# Patient Record
Sex: Male | Born: 1955 | Race: White | Hispanic: No | Marital: Married | Smoking: Never smoker
Health system: Southern US, Community
[De-identification: ages and names within clinical notes are randomized; demographics above are authoritative.]

## PROBLEM LIST (undated history)

## (undated) DIAGNOSIS — G4730 Sleep apnea, unspecified: Secondary | ICD-10-CM

## (undated) DIAGNOSIS — M109 Gout, unspecified: Secondary | ICD-10-CM

## (undated) DIAGNOSIS — I10 Essential (primary) hypertension: Secondary | ICD-10-CM

## (undated) DIAGNOSIS — G4733 Obstructive sleep apnea (adult) (pediatric): Principal | ICD-10-CM

## (undated) HISTORY — PX: COLONOSCOPY: SHX174

## (undated) HISTORY — PX: POLYPECTOMY: SHX149

## (undated) HISTORY — DX: Gout, unspecified: M10.9

## (undated) HISTORY — DX: Obstructive sleep apnea (adult) (pediatric): G47.33

## (undated) HISTORY — PX: ROTATOR CUFF REPAIR: SHX139

## (undated) HISTORY — DX: Sleep apnea, unspecified: G47.30

## (undated) HISTORY — DX: Essential (primary) hypertension: I10

---

## 1998-05-12 ENCOUNTER — Emergency Department (HOSPITAL_COMMUNITY): Admission: EM | Admit: 1998-05-12 | Discharge: 1998-05-12 | Payer: Self-pay | Admitting: Emergency Medicine

## 2005-01-16 ENCOUNTER — Ambulatory Visit: Payer: Self-pay | Admitting: Family Medicine

## 2005-03-20 ENCOUNTER — Ambulatory Visit: Payer: Self-pay | Admitting: Family Medicine

## 2005-03-28 ENCOUNTER — Ambulatory Visit: Payer: Self-pay | Admitting: Family Medicine

## 2005-04-11 ENCOUNTER — Ambulatory Visit: Payer: Self-pay | Admitting: Family Medicine

## 2006-09-14 ENCOUNTER — Ambulatory Visit: Payer: Self-pay | Admitting: Family Medicine

## 2008-01-17 ENCOUNTER — Ambulatory Visit: Payer: Self-pay | Admitting: Family Medicine

## 2008-01-17 LAB — CONVERTED CEMR LAB
AST: 24 units/L (ref 0–37)
Albumin: 4.3 g/dL (ref 3.5–5.2)
Basophils Absolute: 0 10*3/uL (ref 0.0–0.1)
Bilirubin Urine: NEGATIVE
Chloride: 100 meq/L (ref 96–112)
Direct LDL: 115.5 mg/dL
Eosinophils Absolute: 0.1 10*3/uL (ref 0.0–0.6)
GFR calc non Af Amer: 75 mL/min
Glucose, Urine, Semiquant: NEGATIVE
HCT: 45 % (ref 39.0–52.0)
HDL: 33.7 mg/dL — ABNORMAL LOW (ref >39.0)
MCHC: 34.8 g/dL (ref 30.0–36.0)
MCV: 91.8 fL (ref 78.0–100.0)
Neutrophils Relative %: 56.9 % (ref 43.0–77.0)
PSA: 0.65 ng/mL (ref 0.10–4.00)
Platelets: 173 10*3/uL (ref 150–400)
RBC: 4.9 M/uL (ref 4.22–5.81)
Sodium: 139 meq/L (ref 135–145)
Specific Gravity, Urine: 1.025
TSH: 1.18 microintl units/mL (ref 0.35–5.50)
Triglycerides: 309 mg/dL (ref 0–149)
pH: 5.5

## 2008-01-24 ENCOUNTER — Ambulatory Visit: Payer: Self-pay | Admitting: Family Medicine

## 2008-01-24 DIAGNOSIS — I10 Essential (primary) hypertension: Secondary | ICD-10-CM

## 2008-11-10 ENCOUNTER — Ambulatory Visit: Payer: Self-pay | Admitting: Gastroenterology

## 2008-11-24 ENCOUNTER — Encounter: Payer: Self-pay | Admitting: Gastroenterology

## 2008-11-24 ENCOUNTER — Ambulatory Visit: Payer: Self-pay | Admitting: Gastroenterology

## 2008-11-24 LAB — HM COLONOSCOPY

## 2008-11-27 ENCOUNTER — Encounter: Payer: Self-pay | Admitting: Gastroenterology

## 2008-12-11 ENCOUNTER — Ambulatory Visit: Payer: Self-pay | Admitting: Family Medicine

## 2008-12-11 DIAGNOSIS — R079 Chest pain, unspecified: Secondary | ICD-10-CM

## 2009-05-29 ENCOUNTER — Ambulatory Visit: Payer: Self-pay | Admitting: Family Medicine

## 2009-05-29 LAB — CONVERTED CEMR LAB
BUN: 10 mg/dL (ref 6–23)
Basophils Relative: 0.8 % (ref 0.0–3.0)
Bilirubin Urine: NEGATIVE
Bilirubin, Direct: 0.2 mg/dL (ref 0.0–0.3)
CO2: 32 meq/L (ref 19–32)
Chloride: 101 meq/L (ref 96–112)
Cholesterol: 215 mg/dL — ABNORMAL HIGH (ref 0–200)
Creatinine, Ser: 1.2 mg/dL (ref 0.4–1.5)
Direct LDL: 122.9 mg/dL
Eosinophils Absolute: 0.1 10*3/uL (ref 0.0–0.7)
Glucose, Urine, Semiquant: NEGATIVE
HCT: 43.3 % (ref 39.0–52.0)
Lymphs Abs: 2.3 10*3/uL (ref 0.7–4.0)
MCHC: 35.4 g/dL (ref 30.0–36.0)
MCV: 94 fL (ref 78.0–100.0)
Monocytes Absolute: 0.5 10*3/uL (ref 0.1–1.0)
Neutrophils Relative %: 49.6 % (ref 43.0–77.0)
PSA: 0.94 ng/mL (ref 0.10–4.00)
Platelets: 172 10*3/uL (ref 150.0–400.0)
Specific Gravity, Urine: >=1.03
TSH: 1.18 microintl units/mL (ref 0.35–5.50)
Total Bilirubin: 1.7 mg/dL — ABNORMAL HIGH (ref 0.3–1.2)
Total Protein: 8 g/dL (ref 6.0–8.3)
pH: 5.5

## 2009-06-05 ENCOUNTER — Ambulatory Visit: Payer: Self-pay | Admitting: Family Medicine

## 2009-06-05 DIAGNOSIS — R5381 Other malaise: Secondary | ICD-10-CM | POA: Insufficient documentation

## 2009-06-05 DIAGNOSIS — R5383 Other fatigue: Secondary | ICD-10-CM

## 2009-06-05 DIAGNOSIS — I491 Atrial premature depolarization: Secondary | ICD-10-CM | POA: Insufficient documentation

## 2010-09-23 ENCOUNTER — Encounter: Payer: Self-pay | Admitting: Family Medicine

## 2010-12-03 NOTE — Miscellaneous (Signed)
Summary: flu vaccine  Clinical Lists Changes  Observations: Added new observation of FLU VAX: Historical (09/21/2010 11:39)      Immunization History:  Influenza Immunization History:    Influenza:  historical (09/21/2010)

## 2011-03-25 ENCOUNTER — Other Ambulatory Visit (INDEPENDENT_AMBULATORY_CARE_PROVIDER_SITE_OTHER): Payer: BC Managed Care – PPO

## 2011-03-25 DIAGNOSIS — Z0000 Encounter for general adult medical examination without abnormal findings: Secondary | ICD-10-CM

## 2011-03-25 DIAGNOSIS — Z13220 Encounter for screening for lipoid disorders: Secondary | ICD-10-CM

## 2011-03-25 LAB — LIPID PANEL
Cholesterol: 206 mg/dL — ABNORMAL HIGH (ref 0–200)
HDL: 39.5 mg/dL (ref >39.00)
VLDL: 88.8 mg/dL — ABNORMAL HIGH (ref 0.0–40.0)

## 2011-03-25 LAB — BASIC METABOLIC PANEL
BUN: 18 mg/dL (ref 6–23)
Chloride: 104 mEq/L (ref 96–112)
Creatinine, Ser: 1.3 mg/dL (ref 0.4–1.5)
GFR: 63.85 mL/min (ref >60.00)

## 2011-03-25 LAB — CBC WITH DIFFERENTIAL/PLATELET
Basophils Absolute: 0 10*3/uL (ref 0.0–0.1)
Eosinophils Absolute: 0.2 10*3/uL (ref 0.0–0.7)
HCT: 42.3 % (ref 39.0–52.0)
Hemoglobin: 14.9 g/dL (ref 13.0–17.0)
Lymphs Abs: 2.2 10*3/uL (ref 0.7–4.0)
MCHC: 35.3 g/dL (ref 30.0–36.0)
Neutro Abs: 2.7 10*3/uL (ref 1.4–7.7)
RDW: 13.1 % (ref 11.5–14.6)

## 2011-03-25 LAB — POCT URINALYSIS DIPSTICK
Glucose, UA: NEGATIVE
Protein, UA: NEGATIVE
Spec Grav, UA: 1.02
Urobilinogen, UA: 0.2

## 2011-03-25 LAB — TSH: TSH: 1.21 u[IU]/mL (ref 0.35–5.50)

## 2011-03-25 LAB — HEPATIC FUNCTION PANEL: Albumin: 4.1 g/dL (ref 3.5–5.2)

## 2011-04-04 ENCOUNTER — Encounter: Payer: Self-pay | Admitting: Family Medicine

## 2011-04-07 ENCOUNTER — Ambulatory Visit (INDEPENDENT_AMBULATORY_CARE_PROVIDER_SITE_OTHER): Payer: BC Managed Care – PPO | Admitting: Family Medicine

## 2011-04-07 ENCOUNTER — Encounter: Payer: Self-pay | Admitting: Family Medicine

## 2011-04-07 VITALS — BP 108/78 | Temp 98.3°F | Ht 73.0 in | Wt 230.0 lb

## 2011-04-07 DIAGNOSIS — I10 Essential (primary) hypertension: Secondary | ICD-10-CM

## 2011-04-07 MED ORDER — ATENOLOL-CHLORTHALIDONE 50-25 MG PO TABS: ORAL_TABLET | ORAL | Status: DC

## 2011-04-07 NOTE — Patient Instructions (Signed)
Continue your current medications.  Follow-up in one year sooner for any problems

## 2011-04-07 NOTE — Progress Notes (Signed)
  Subjective:    Patient ID: Garrett Avila, male    DOB: 06/23/1956, 55 y.o.   MRN: [id number]  Garrett Avila is a delightful, 55 year old, married man nonsmoker, who comes in today for physical examination because of a history of underlying hypertension.  He takes Tenoretic 50 -- 25 dose one half tab daily BP 108/78.  He gets routine eye care, dental care, colonoscopy two years ago, showed one polyp, tetanus, 2009  Review of systems negative except for pain in his left heel.  He is currently going to [facility].  He has a bone spur.  In addition to his normal work at [employer].  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  He gets a lot of sun exposure.  Advised sun protection daily    Review of Systems  Constitutional: Negative.   HENT: Negative.   Eyes: Negative.   Respiratory: Negative.   Cardiovascular: Negative.   Gastrointestinal: Negative.   Genitourinary: Negative.   Musculoskeletal: Negative.   Skin: Negative.   Neurological: Negative.   Hematological: Negative.   Psychiatric/Behavioral: Negative.        Objective:   Physical Exam  Constitutional: He is oriented to person, place, and time. He appears well-developed and well-nourished.  HENT:  Head: Normocephalic and atraumatic.  Right Ear: External ear normal.  Left Ear: External ear normal.  Nose: Nose normal.  Mouth/Throat: Oropharynx is clear and moist.  Eyes: Conjunctivae and EOM are normal. Pupils are equal, round, and reactive to light.  Neck: Normal range of motion. Neck supple. No JVD present. No tracheal deviation present. No thyromegaly present.  Cardiovascular: Normal rate, regular rhythm, normal heart sounds and intact distal pulses.  Exam reveals no gallop and no friction rub.   No murmur heard. Pulmonary/Chest: Effort normal and breath sounds normal. No stridor. No respiratory distress. He has no wheezes. He has no rales. He exhibits no tenderness.  Abdominal: Soft. Bowel sounds are normal. He exhibits no distension and  no mass. There is no tenderness. There is no rebound and no guarding.  Genitourinary: Rectum normal, prostate normal and penis normal. Guaiac negative stool. No penile tenderness.  Musculoskeletal: Normal range of motion. He exhibits no edema and no tenderness.  Lymphadenopathy:    He has no cervical adenopathy.  Neurological: He is alert and oriented to person, place, and time. He has normal reflexes. No cranial nerve deficit. He exhibits normal muscle tone.  Skin: Skin is warm and dry. No rash noted. No erythema. No pallor.  Psychiatric: He has a normal mood and affect. His behavior is normal. Judgment and thought content normal.          Assessment & Plan:  Hypertension continue current medications follow-up in one year, sooner for any problems

## 2012-04-13 ENCOUNTER — Other Ambulatory Visit: Payer: Self-pay | Admitting: Family Medicine

## 2012-04-29 ENCOUNTER — Ambulatory Visit (INDEPENDENT_AMBULATORY_CARE_PROVIDER_SITE_OTHER): Payer: BC Managed Care – PPO | Admitting: Family Medicine

## 2012-04-29 ENCOUNTER — Encounter: Payer: Self-pay | Admitting: Family Medicine

## 2012-04-29 VITALS — BP 110/80 | Temp 98.2°F | Wt 226.0 lb

## 2012-04-29 DIAGNOSIS — L03032 Cellulitis of left toe: Secondary | ICD-10-CM

## 2012-04-29 DIAGNOSIS — L02619 Cutaneous abscess of unspecified foot: Secondary | ICD-10-CM

## 2012-04-29 LAB — URIC ACID: Uric Acid, Serum: 8.3 mg/dL — ABNORMAL HIGH (ref 4.0–7.8)

## 2012-04-29 MED ORDER — CEPHALEXIN 500 MG PO CAPS: ORAL_CAPSULE | ORAL | Status: DC

## 2012-04-29 NOTE — Progress Notes (Signed)
  Subjective:    Patient ID: Garrett Avila, male    DOB: 11-03-1956, 56 y.o.   MRN: [id number]  HPI Garrett Avila is a 56 year old married male nonsmoker nondrinker who comes in today for evaluation of pain and redness of his left great toe x6 days  He states last week he was at the beach and on Friday he noticed some redness of his left great. It involves the distal joint and just the dorsum of the toe. No history of trauma no family history of gout it really hasn't changed since that time no fever chills   Review of Systems General and dermatologic review of systems otherwise negative    Objective:   Physical Exam  well-developed well-nourished male in no acute distress examination of the foot shows the skin to be normal except for some erythema of the dorsum of the left great toe distal joint. No pus       Assessment & Plan:  Probable cellulitis left great toe plan check uric acid level in the meantime treat with Keflex and warm soaks

## 2012-04-29 NOTE — Patient Instructions (Signed)
Take 2 of the Keflex tablets twice daily  Soaked her foot in warm water for 15 minutes twice daily  Fleet Contras will call you tomorrow about your lab work

## 2012-07-22 ENCOUNTER — Other Ambulatory Visit: Payer: Self-pay | Admitting: Family Medicine

## 2012-12-02 ENCOUNTER — Other Ambulatory Visit: Payer: Self-pay | Admitting: Family Medicine

## 2012-12-02 DIAGNOSIS — Z8349 Family history of other endocrine, nutritional and metabolic diseases: Secondary | ICD-10-CM

## 2012-12-06 ENCOUNTER — Other Ambulatory Visit (INDEPENDENT_AMBULATORY_CARE_PROVIDER_SITE_OTHER): Payer: BC Managed Care – PPO

## 2012-12-06 DIAGNOSIS — Z8349 Family history of other endocrine, nutritional and metabolic diseases: Secondary | ICD-10-CM

## 2012-12-06 DIAGNOSIS — Z0000 Encounter for general adult medical examination without abnormal findings: Secondary | ICD-10-CM

## 2012-12-06 LAB — POCT URINALYSIS DIPSTICK
Bilirubin, UA: NEGATIVE
Blood, UA: NEGATIVE
Nitrite, UA: NEGATIVE
Spec Grav, UA: 1.02
pH, UA: 7

## 2012-12-06 LAB — CBC WITH DIFFERENTIAL/PLATELET
Basophils Relative: 0.4 % (ref 0.0–3.0)
Eosinophils Relative: 2.1 % (ref 0.0–5.0)
HCT: 44.1 % (ref 39.0–52.0)
Hemoglobin: 15.2 g/dL (ref 13.0–17.0)
Lymphs Abs: 2.3 10*3/uL (ref 0.7–4.0)
MCV: 92.1 fl (ref 78.0–100.0)
Monocytes Absolute: 0.5 10*3/uL (ref 0.1–1.0)
RBC: 4.79 Mil/uL (ref 4.22–5.81)
WBC: 5.8 10*3/uL (ref 4.5–10.5)

## 2012-12-06 LAB — HEPATIC FUNCTION PANEL
ALT: 24 U/L (ref 0–53)
AST: 24 U/L (ref 0–37)
Bilirubin, Direct: 0.1 mg/dL (ref 0.0–0.3)
Total Protein: 7.5 g/dL (ref 6.0–8.3)

## 2012-12-06 LAB — LIPID PANEL
HDL: 37.9 mg/dL — ABNORMAL LOW (ref >39.00)
Total CHOL/HDL Ratio: 6

## 2012-12-06 LAB — BASIC METABOLIC PANEL
Chloride: 99 mEq/L (ref 96–112)
Potassium: 3.6 mEq/L (ref 3.5–5.1)
Sodium: 138 mEq/L (ref 135–145)

## 2012-12-06 LAB — LDL CHOLESTEROL, DIRECT: Direct LDL: 116.6 mg/dL

## 2012-12-06 LAB — FERRITIN: Ferritin: 119.2 ng/mL (ref 22.0–322.0)

## 2012-12-06 LAB — IBC PANEL: Iron: 99 ug/dL (ref 42–165)

## 2012-12-06 LAB — TSH: TSH: 1.33 u[IU]/mL (ref 0.35–5.50)

## 2012-12-06 LAB — PSA: PSA: 0.86 ng/mL (ref 0.10–4.00)

## 2012-12-06 NOTE — Addendum Note (Signed)
Addended by: Bonnye Fava on: 12/06/2012 11:03 AM   Modules accepted: Orders

## 2012-12-10 LAB — HEMOCHROMATOSIS DNA-PCR(C282Y,H63D)

## 2012-12-13 ENCOUNTER — Encounter: Payer: Self-pay | Admitting: Family Medicine

## 2012-12-13 ENCOUNTER — Ambulatory Visit (INDEPENDENT_AMBULATORY_CARE_PROVIDER_SITE_OTHER): Payer: BC Managed Care – PPO | Admitting: Family Medicine

## 2012-12-13 VITALS — BP 140/88 | HR 60 | Temp 98.6°F | Ht 72.5 in | Wt 209.0 lb

## 2012-12-13 DIAGNOSIS — I10 Essential (primary) hypertension: Secondary | ICD-10-CM

## 2012-12-13 DIAGNOSIS — I491 Atrial premature depolarization: Secondary | ICD-10-CM

## 2012-12-13 MED ORDER — ATENOLOL-CHLORTHALIDONE 50-25 MG PO TABS: ORAL_TABLET | ORAL | Status: DC

## 2012-12-13 NOTE — Progress Notes (Signed)
  Subjective:    Patient ID: Garrett Avila, male    DOB: 1956/02/22, 57 y.o.   MRN: [id number]  HPI Garrett Avila is a 57 year old married male nonsmoker who comes in today for annual physical examination because of a history of hypertension  He takes Tenoretic 50 days 25 dose one half tab daily BP at home 120/76  He is a truck driver by trade and works at [employer]  He gets routine eye care, dental care, colonoscopy normal, seasonal flu shot 2013, tetanus 2009.   Review of Systems  Constitutional: Negative.   HENT: Negative.   Eyes: Negative.   Respiratory: Negative.   Cardiovascular: Negative.   Gastrointestinal: Negative.   Genitourinary: Negative.   Musculoskeletal: Negative.   Skin: Negative.   Neurological: Negative.   Psychiatric/Behavioral: Negative.        Objective:   Physical Exam  Constitutional: He is oriented to person, place, and time. He appears well-developed and well-nourished.  HENT:  Head: Normocephalic and atraumatic.  Right Ear: External ear normal.  Left Ear: External ear normal.  Nose: Nose normal.  Mouth/Throat: Oropharynx is clear and moist.  Eyes: Conjunctivae and EOM are normal. Pupils are equal, round, and reactive to light.  Neck: Normal range of motion. Neck supple. No JVD present. No tracheal deviation present. No thyromegaly present.  Cardiovascular: Normal rate, regular rhythm, normal heart sounds and intact distal pulses.  Exam reveals no gallop and no friction rub.   No murmur heard. Pulmonary/Chest: Effort normal and breath sounds normal. No stridor. No respiratory distress. He has no wheezes. He has no rales. He exhibits no tenderness.  Abdominal: Soft. Bowel sounds are normal. He exhibits no distension and no mass. There is no tenderness. There is no rebound and no guarding.  Genitourinary: Rectum normal, prostate normal and penis normal. Guaiac negative stool. No penile tenderness.  Musculoskeletal: Normal range of motion. He exhibits no edema  and no tenderness.  Lymphadenopathy:    He has no cervical adenopathy.  Neurological: He is alert and oriented to person, place, and time. He has normal reflexes. No cranial nerve deficit. He exhibits normal muscle tone.  Skin: Skin is warm and dry. No rash noted. No erythema. No pallor.  Total body skin exam normal light skin and light eyes going raised in [place]  Psychiatric: He has a normal mood and affect. His behavior is normal. Judgment and thought content normal.          Assessment & Plan:  Healthy male  Hypertension at goal continue current medication  Risk for skin cancer again advise sunscreens SPF 50

## 2012-12-13 NOTE — Patient Instructions (Signed)
Continue your current medications  Remember your skin protection SPF 50+  Return in one year for general physical examination sooner if any problems

## 2013-07-06 ENCOUNTER — Other Ambulatory Visit: Payer: Self-pay | Admitting: Family Medicine

## 2013-10-14 ENCOUNTER — Encounter: Payer: Self-pay | Admitting: Internal Medicine

## 2013-12-14 ENCOUNTER — Encounter: Payer: Self-pay | Admitting: Gastroenterology

## 2014-02-24 ENCOUNTER — Ambulatory Visit (AMBULATORY_SURGERY_CENTER): Payer: Self-pay

## 2014-02-24 VITALS — Ht 73.0 in | Wt 208.0 lb

## 2014-02-24 DIAGNOSIS — Z860100 Personal history of colon polyps, unspecified: Secondary | ICD-10-CM

## 2014-02-24 MED ORDER — SOD PICOSULFATE-MAG OX-CIT ACD 10-3.5-12 MG-GM-GM PO PACK: 1.0000 | PACK | Freq: Once | ORAL | Status: DC

## 2014-02-24 NOTE — Progress Notes (Signed)
No allergies to eggs or soy. No home oxygen. No diet/weight loss meds. No email.

## 2014-03-01 ENCOUNTER — Encounter: Payer: Self-pay | Admitting: Gastroenterology

## 2014-03-03 ENCOUNTER — Encounter: Payer: Self-pay | Admitting: Gastroenterology

## 2014-03-03 ENCOUNTER — Ambulatory Visit (AMBULATORY_SURGERY_CENTER): Payer: BC Managed Care – PPO | Admitting: Gastroenterology

## 2014-03-03 VITALS — BP 117/72 | HR 51 | Temp 96.9°F | Resp 16 | Ht 73.0 in | Wt 208.0 lb

## 2014-03-03 DIAGNOSIS — D129 Benign neoplasm of anus and anal canal: Secondary | ICD-10-CM

## 2014-03-03 DIAGNOSIS — D128 Benign neoplasm of rectum: Secondary | ICD-10-CM

## 2014-03-03 DIAGNOSIS — D126 Benign neoplasm of colon, unspecified: Secondary | ICD-10-CM

## 2014-03-03 DIAGNOSIS — Z86010 Personal history of colonic polyps: Secondary | ICD-10-CM

## 2014-03-03 MED ORDER — SODIUM CHLORIDE 0.9 % IV SOLN: 500.0000 mL | INTRAVENOUS | Status: DC

## 2014-03-03 NOTE — Progress Notes (Signed)
Called to room to assist during endoscopic procedure.  Patient ID and intended procedure confirmed with present staff. Received instructions for my participation in the procedure from the performing physician.  

## 2014-03-03 NOTE — Progress Notes (Signed)
Procedure ends, to recovery, report given and VSS. 

## 2014-03-03 NOTE — Op Note (Signed)
[facility]  [address] [place], [id number]   COLONOSCOPY PROCEDURE REPORT  PATIENT: Garrett Avila, Garrett Avila  MR#: [id number] BIRTHDATE: 08/18/1956 , 18  yrs. old GENDER: Male ENDOSCOPIST: Ladene Artist, MD, [facility] PROCEDURE DATE:  03/03/2014 PROCEDURE:   Colonoscopy with biopsy First Screening Colonoscopy - Avg.  risk and is 50 yrs.  old or older - No.  Prior Negative Screening - Now for repeat screening. N/A  History of Adenoma - Now for follow-up colonoscopy & has been > or = to 3 yrs.  Yes hx of adenoma.  Has been 3 or more years since last colonoscopy.  Polyps Removed Today? Yes. ASA CLASS:   Class II INDICATIONS:Patient's personal history of adenomatous colon polyps.  MEDICATIONS: MAC sedation, administered by CRNA and propofol (Diprivan) 200mg  IV DESCRIPTION OF PROCEDURE:   After the risks benefits and alternatives of the procedure were thoroughly explained, informed consent was obtained.  A digital rectal exam revealed no abnormalities of the rectum.   The LB [id number]  endoscope was introduced through the anus and advanced to the cecum, which was identified by both the appendix and ileocecal valve. No adverse events experienced.   The quality of the prep was [place] good The instrument was then slowly withdrawn as the colon was fully examined.  COLON FINDINGS: A sessile polyp measuring 4 mm in size was found in the transverse colon.  A polypectomy was performed with cold forceps.  The resection was complete and the polyp tissue was completely retrieved.   The colon was otherwise normal.  There was no diverticulosis, inflammation, polyps or cancers unless previously stated.  Retroflexed views revealed small internal hemorrhoids. The time to cecum=1 minutes 55 seconds.  Withdrawal time=10 minutes 51 seconds.  The scope was withdrawn and the procedure completed.  COMPLICATIONS: There were no complications.  ENDOSCOPIC IMPRESSION: 1.   Sessile  polyp measuring 4 mm in the transverse colon; polypectomy performed with cold forceps 2.   Small internal hemorrhoids  RECOMMENDATIONS: 1.  Await pathology results 2.  Repeat Colonoscopy in 5 years.  eSigned:  Ladene Artist, MD, [facility] 03/03/2014 9:25 AM

## 2014-03-03 NOTE — Patient Instructions (Signed)
YOU HAD AN ENDOSCOPIC PROCEDURE TODAY AT THE [facility]: Refer to the procedure report that was given to you for any specific questions about what was found during the examination.  If the procedure report does not answer your questions, please call your gastroenterologist to clarify.  If you requested that your care partner not be given the details of your procedure findings, then the procedure report has been included in a sealed envelope for you to review at your convenience later.  YOU SHOULD EXPECT: Some feelings of bloating in the abdomen. Passage of more gas than usual.  Walking can help get rid of the air that was put into your GI tract during the procedure and reduce the bloating. If you had a lower endoscopy (such as a colonoscopy or flexible sigmoidoscopy) you may notice spotting of blood in your stool or on the toilet paper. If you underwent a bowel prep for your procedure, then you may not have a normal bowel movement for a few days.  DIET: Your first meal following the procedure should be a light meal and then it is ok to progress to your normal diet.  A half-sandwich or bowl of soup is an example of a good first meal.  Heavy or fried foods are harder to digest and may make you feel nauseous or bloated.  Likewise meals heavy in dairy and vegetables can cause extra gas to form and this can also increase the bloating.  Drink plenty of fluids but you should avoid alcoholic beverages for 24 hours.  ACTIVITY: Your care partner should take you home directly after the procedure.  You should plan to take it easy, moving slowly for the rest of the day.  You can resume normal activity the day after the procedure however you should NOT DRIVE or use heavy machinery for 24 hours (because of the sedation medicines used during the test).    SYMPTOMS TO REPORT IMMEDIATELY: A gastroenterologist can be reached at any hour.  During normal business hours, 8:30 AM to 5:00 PM Monday through Friday,  call [phone].  After hours and on weekends, please call the GI answering service at [phone] who will take a message and have the physician on call contact you.   Following lower endoscopy (colonoscopy or flexible sigmoidoscopy):  Excessive amounts of blood in the stool  Significant tenderness or worsening of abdominal pains  Swelling of the abdomen that is new, acute  Fever of 100F or higher    FOLLOW UP: If any biopsies were taken you will be contacted by phone or by letter within the next 1-3 weeks.  Call your gastroenterologist if you have not heard about the biopsies in 3 weeks.  Our staff will call the home number listed on your records the next business day following your procedure to check on you and address any questions or concerns that you may have at that time regarding the information given to you following your procedure. This is a courtesy call and so if there is no answer at the home number and we have not heard from you through the emergency physician on call, we will assume that you have returned to your regular daily activities without incident.  SIGNATURES/CONFIDENTIALITY: You and/or your care partner have signed paperwork which will be entered into your electronic medical record.  These signatures attest to the fact that that the information above on your After Visit Summary has been reviewed and is understood.  Full responsibility of the confidentiality   of this discharge information lies with you and/or your care-partner.     

## 2014-03-06 ENCOUNTER — Telehealth: Payer: Self-pay | Admitting: *Deleted

## 2014-03-06 NOTE — Telephone Encounter (Signed)
  Follow up Call-  Call back number 03/03/2014  Post procedure Call Back phone  # [phone]  Permission to leave phone message Yes     Patient questions:  Do you have a fever, pain , or abdominal swelling? no Pain Score  0 *  Have you tolerated food without any problems? yes  Have you been able to return to your normal activities? yes  Do you have any questions about your discharge instructions: Diet   no Medications  no Follow up visit  no  Do you have questions or concerns about your Care? no  Actions: * If pain score is 4 or above: No action needed, pain <4.

## 2014-03-07 ENCOUNTER — Encounter: Payer: Self-pay | Admitting: Gastroenterology

## 2014-06-08 ENCOUNTER — Telehealth: Payer: Self-pay | Admitting: Family Medicine

## 2014-06-08 MED ORDER — ATENOLOL-CHLORTHALIDONE 50-25 MG PO TABS: ORAL_TABLET | ORAL | Status: DC

## 2014-06-08 NOTE — Telephone Encounter (Signed)
Pt is on vacation at the beach and left medicine at home atenolol-chlorthalidone (TENORETIC) 50-25 MG per tablet   Would like about 4 days worth of pills sent to    [facility] [employer] number [phone]

## 2014-07-24 ENCOUNTER — Other Ambulatory Visit: Payer: Self-pay | Admitting: Family Medicine

## 2014-09-07 ENCOUNTER — Other Ambulatory Visit (INDEPENDENT_AMBULATORY_CARE_PROVIDER_SITE_OTHER): Payer: BC Managed Care – PPO

## 2014-09-07 DIAGNOSIS — E785 Hyperlipidemia, unspecified: Secondary | ICD-10-CM

## 2014-09-07 DIAGNOSIS — Z0000 Encounter for general adult medical examination without abnormal findings: Secondary | ICD-10-CM

## 2014-09-07 LAB — PSA: PSA: 1 ng/mL (ref 0.10–4.00)

## 2014-09-07 LAB — CBC WITH DIFFERENTIAL/PLATELET
Basophils Absolute: 0 10*3/uL (ref 0.0–0.1)
Basophils Relative: 0.4 % (ref 0.0–3.0)
EOS ABS: 0.2 10*3/uL (ref 0.0–0.7)
Eosinophils Relative: 2.4 % (ref 0.0–5.0)
HEMATOCRIT: 44.2 % (ref 39.0–52.0)
Hemoglobin: 14.9 g/dL (ref 13.0–17.0)
LYMPHS ABS: 2.4 10*3/uL (ref 0.7–4.0)
Lymphocytes Relative: 36.1 % (ref 12.0–46.0)
MCHC: 33.8 g/dL (ref 30.0–36.0)
MCV: 94.3 fl (ref 78.0–100.0)
MONO ABS: 0.6 10*3/uL (ref 0.1–1.0)
Monocytes Relative: 8.6 % (ref 3.0–12.0)
Neutro Abs: 3.5 10*3/uL (ref 1.4–7.7)
Neutrophils Relative %: 52.5 % (ref 43.0–77.0)
Platelets: 185 10*3/uL (ref 150.0–400.0)
RBC: 4.69 Mil/uL (ref 4.22–5.81)
RDW: 13.1 % (ref 11.5–15.5)
WBC: 6.7 10*3/uL (ref 4.0–10.5)

## 2014-09-07 LAB — POCT URINALYSIS DIPSTICK
BILIRUBIN UA: NEGATIVE
Blood, UA: NEGATIVE
GLUCOSE UA: NEGATIVE
Ketones, UA: NEGATIVE
Leukocytes, UA: NEGATIVE
Nitrite, UA: NEGATIVE
Protein, UA: NEGATIVE
SPEC GRAV UA: 1.015
UROBILINOGEN UA: 0.2
pH, UA: 7

## 2014-09-07 LAB — BASIC METABOLIC PANEL
BUN: 14 mg/dL (ref 6–23)
CHLORIDE: 101 meq/L (ref 96–112)
CO2: 27 mEq/L (ref 19–32)
Calcium: 9.6 mg/dL (ref 8.4–10.5)
Creatinine, Ser: 1.2 mg/dL (ref 0.4–1.5)
GFR: 66.74 mL/min (ref >60.00)
Glucose, Bld: 82 mg/dL (ref 70–99)
POTASSIUM: 3.8 meq/L (ref 3.5–5.1)
Sodium: 140 mEq/L (ref 135–145)

## 2014-09-07 LAB — TSH: TSH: 0.69 u[IU]/mL (ref 0.35–4.50)

## 2014-09-07 LAB — LIPID PANEL
CHOLESTEROL: 210 mg/dL — AB (ref 0–200)
HDL: 34.9 mg/dL — ABNORMAL LOW (ref >39.00)
NonHDL: 175.1
Total CHOL/HDL Ratio: 6
Triglycerides: 304 mg/dL — ABNORMAL HIGH (ref 0.0–149.0)
VLDL: 60.8 mg/dL — ABNORMAL HIGH (ref 0.0–40.0)

## 2014-09-07 LAB — HEPATIC FUNCTION PANEL
ALK PHOS: 68 U/L (ref 39–117)
ALT: 27 U/L (ref 0–53)
AST: 26 U/L (ref 0–37)
Albumin: 3.7 g/dL (ref 3.5–5.2)
BILIRUBIN TOTAL: 1.2 mg/dL (ref 0.2–1.2)
Bilirubin, Direct: 0.2 mg/dL (ref 0.0–0.3)
Total Protein: 7.7 g/dL (ref 6.0–8.3)

## 2014-09-07 LAB — LDL CHOLESTEROL, DIRECT: Direct LDL: 114.4 mg/dL

## 2014-09-14 ENCOUNTER — Encounter: Payer: Self-pay | Admitting: Family Medicine

## 2014-09-14 ENCOUNTER — Ambulatory Visit (INDEPENDENT_AMBULATORY_CARE_PROVIDER_SITE_OTHER): Payer: BC Managed Care – PPO | Admitting: Family Medicine

## 2014-09-14 DIAGNOSIS — I10 Essential (primary) hypertension: Secondary | ICD-10-CM

## 2014-09-14 MED ORDER — ATENOLOL-CHLORTHALIDONE 50-25 MG PO TABS: ORAL_TABLET | ORAL | Status: DC

## 2014-09-14 NOTE — Progress Notes (Signed)
   Subjective:    Patient ID: Garrett Avila, male    DOB: Mar 16, 1956, 58 y.o.   MRN: [id number]  HPI Garrett Avila is a delightful 58 year old married male nonsmoker who comes in today for general physical examination because of a history of underlying hypertension  He takes Tenoretic 50-12 0.5 dose one half tab daily BP 120/80  He gets routine eye care, dental care, colonoscopy recently normal, vaccinations updated by Apolonio Schneiders  His son-in-law's and ENT is going to give him a flu shot at home  He is married lives here in [place] originally from [place] area. 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   HENT: Negative.   Eyes: Negative.   Respiratory: Negative.   Cardiovascular: Negative.   Gastrointestinal: Negative.   Endocrine: Negative.   Genitourinary: Negative.   Musculoskeletal: Negative.   Skin: Negative.   Allergic/Immunologic: Negative.   Neurological: Negative.   Hematological: Negative.   Psychiatric/Behavioral: Negative.        Objective:   Physical Exam  Constitutional: He is oriented to person, place, and time. He appears well-developed and well-nourished.  HENT:  Head: Normocephalic and atraumatic.  Right Ear: External ear normal.  Left Ear: External ear normal.  Nose: Nose normal.  Mouth/Throat: Oropharynx is clear and moist.  Eyes: Conjunctivae and EOM are normal. Pupils are equal, round, and reactive to light.  Neck: Normal range of motion. Neck supple. No JVD present. No tracheal deviation present. No thyromegaly present.  Cardiovascular: Normal rate, regular rhythm, normal heart sounds and intact distal pulses.  Exam reveals no gallop and no friction rub.   No murmur heard. Pulmonary/Chest: Effort normal and breath sounds normal. No stridor. No respiratory distress. He has no wheezes. He has no rales. He exhibits no  tenderness.  Abdominal: Soft. Bowel sounds are normal. He exhibits no distension and no mass. There is no tenderness. There is no rebound and no guarding.  Genitourinary: Rectum normal, prostate normal and penis normal. Guaiac negative stool. No penile tenderness.  Musculoskeletal: Normal range of motion. He exhibits no edema or tenderness.  Lymphadenopathy:    He has no cervical adenopathy.  Neurological: He is alert and oriented to person, place, and time. He has normal reflexes. No cranial nerve deficit. He exhibits normal muscle tone.  Skin: Skin is warm and dry. No rash noted. No erythema. No pallor.  Light skin and light eyes total body skin exam normal  Psychiatric: He has a normal mood and affect. His behavior is normal. Judgment and thought content normal.  Nursing note and vitals reviewed.         Assessment & Plan:  Healthy male  Hypertension ago......... Continue current medication

## 2014-09-14 NOTE — Progress Notes (Signed)
Pre visit review using our clinic review tool, if applicable. No additional management support is needed unless otherwise documented below in the visit note. 

## 2014-09-14 NOTE — Patient Instructions (Signed)
Continue your current medications  Followup in 1 year sooner if any problems 

## 2014-09-20 ENCOUNTER — Telehealth: Payer: Self-pay | Admitting: Family Medicine

## 2014-09-20 NOTE — Telephone Encounter (Signed)
emmi mailed  °

## 2014-10-25 ENCOUNTER — Encounter: Payer: Self-pay | Admitting: Family Medicine

## 2014-10-25 ENCOUNTER — Ambulatory Visit (INDEPENDENT_AMBULATORY_CARE_PROVIDER_SITE_OTHER): Payer: BC Managed Care – PPO | Admitting: Family Medicine

## 2014-10-25 ENCOUNTER — Telehealth: Payer: Self-pay | Admitting: *Deleted

## 2014-10-25 VITALS — BP 120/84 | HR 64 | Temp 98.4°F | Ht 73.0 in | Wt 223.0 lb

## 2014-10-25 DIAGNOSIS — M1000 Idiopathic gout, unspecified site: Secondary | ICD-10-CM

## 2014-10-25 DIAGNOSIS — M109 Gout, unspecified: Secondary | ICD-10-CM | POA: Insufficient documentation

## 2014-10-25 MED ORDER — METHYLPREDNISOLONE 4 MG PO KIT: PACK | ORAL | Status: AC

## 2014-10-25 NOTE — Telephone Encounter (Signed)
Patient is requesting to switch to Dr Yong Channel.  Please call to schedule establish care appointment.  Thank you.

## 2014-10-25 NOTE — Progress Notes (Signed)
   Subjective:    Patient ID: Garrett Avila, male    DOB: [birth year], 58 y.o.   MRN: [id number]  HPI Here for 4 days of swelling and pain at the base of the left great toe. It feels better today. No recent trauma. This happened once before in the same spot about a year ago. Using Motrin.    Review of Systems  Constitutional: Negative.   Musculoskeletal: Positive for joint swelling and arthralgias.       Objective:   Physical Exam  Constitutional: He appears well-developed and well-nourished.  Musculoskeletal:  The left great toe is tender at the MTP with swelling. No warmth or erythema          Assessment & Plan:  This is gout. Given a Medrol dose pack.

## 2014-10-25 NOTE — Progress Notes (Signed)
Pre visit review using our clinic review tool, if applicable. No additional management support is needed unless otherwise documented below in the visit note. 

## 2014-10-26 NOTE — Telephone Encounter (Signed)
Pt has been sch

## 2014-10-26 NOTE — Telephone Encounter (Signed)
L/m with pt wife

## 2015-04-10 ENCOUNTER — Ambulatory Visit: Payer: BC Managed Care – PPO | Admitting: Family Medicine

## 2015-04-25 ENCOUNTER — Encounter: Payer: Self-pay | Admitting: Gastroenterology

## 2015-06-04 ENCOUNTER — Ambulatory Visit: Payer: Self-pay | Admitting: Family Medicine

## 2015-10-02 ENCOUNTER — Other Ambulatory Visit: Payer: Self-pay | Admitting: Family Medicine

## 2015-12-31 ENCOUNTER — Other Ambulatory Visit: Payer: Self-pay | Admitting: Family Medicine

## 2016-04-28 ENCOUNTER — Encounter: Payer: Self-pay | Admitting: Family Medicine

## 2016-04-28 ENCOUNTER — Other Ambulatory Visit (INDEPENDENT_AMBULATORY_CARE_PROVIDER_SITE_OTHER): Payer: Self-pay

## 2016-04-28 DIAGNOSIS — R7989 Other specified abnormal findings of blood chemistry: Secondary | ICD-10-CM

## 2016-04-28 DIAGNOSIS — Z0000 Encounter for general adult medical examination without abnormal findings: Secondary | ICD-10-CM

## 2016-04-28 DIAGNOSIS — R319 Hematuria, unspecified: Secondary | ICD-10-CM | POA: Diagnosis not present

## 2016-04-28 LAB — BASIC METABOLIC PANEL
BUN: 14 mg/dL (ref 6–23)
CALCIUM: 9.9 mg/dL (ref 8.4–10.5)
CHLORIDE: 99 meq/L (ref 96–112)
CO2: 31 meq/L (ref 19–32)
Creatinine, Ser: 1.35 mg/dL (ref 0.40–1.50)
GFR: 57.37 mL/min — ABNORMAL LOW (ref >60.00)
GLUCOSE: 133 mg/dL — AB (ref 70–99)
POTASSIUM: 4 meq/L (ref 3.5–5.1)
SODIUM: 139 meq/L (ref 135–145)

## 2016-04-28 LAB — POC URINALSYSI DIPSTICK (AUTOMATED)
Bilirubin, UA: NEGATIVE
GLUCOSE UA: NEGATIVE
Ketones, UA: NEGATIVE
Leukocytes, UA: NEGATIVE
NITRITE UA: NEGATIVE
PH UA: 7
SPEC GRAV UA: 1.02
UROBILINOGEN UA: 0.2

## 2016-04-28 LAB — LIPID PANEL
CHOLESTEROL: 234 mg/dL — AB (ref 0–200)
HDL: 43.4 mg/dL (ref >39.00)
NonHDL: 190.53
Total CHOL/HDL Ratio: 5
Triglycerides: 314 mg/dL — ABNORMAL HIGH (ref 0.0–149.0)
VLDL: 62.8 mg/dL — AB (ref 0.0–40.0)

## 2016-04-28 LAB — URINALYSIS, MICROSCOPIC ONLY: RBC / HPF: NONE SEEN (ref 0–?)

## 2016-04-28 LAB — CBC WITH DIFFERENTIAL/PLATELET
BASOS PCT: 0.4 % (ref 0.0–3.0)
Basophils Absolute: 0 10*3/uL (ref 0.0–0.1)
EOS PCT: 1.3 % (ref 0.0–5.0)
Eosinophils Absolute: 0.1 10*3/uL (ref 0.0–0.7)
HEMATOCRIT: 46.3 % (ref 39.0–52.0)
Hemoglobin: 15.7 g/dL (ref 13.0–17.0)
LYMPHS ABS: 2.2 10*3/uL (ref 0.7–4.0)
Lymphocytes Relative: 29.5 % (ref 12.0–46.0)
MCHC: 33.9 g/dL (ref 30.0–36.0)
MCV: 93 fl (ref 78.0–100.0)
MONOS PCT: 6.7 % (ref 3.0–12.0)
Monocytes Absolute: 0.5 10*3/uL (ref 0.1–1.0)
NEUTROS ABS: 4.6 10*3/uL (ref 1.4–7.7)
NEUTROS PCT: 62.1 % (ref 43.0–77.0)
PLATELETS: 180 10*3/uL (ref 150.0–400.0)
RBC: 4.98 Mil/uL (ref 4.22–5.81)
RDW: 13.5 % (ref 11.5–15.5)
WBC: 7.5 10*3/uL (ref 4.0–10.5)

## 2016-04-28 LAB — PSA: PSA: 1.2 ng/mL (ref 0.10–4.00)

## 2016-04-28 LAB — LDL CHOLESTEROL, DIRECT: LDL DIRECT: 139 mg/dL

## 2016-04-28 LAB — TSH: TSH: 1.39 u[IU]/mL (ref 0.35–4.50)

## 2016-04-28 LAB — HEPATIC FUNCTION PANEL
ALBUMIN: 4.5 g/dL (ref 3.5–5.2)
ALT: 28 U/L (ref 0–53)
AST: 24 U/L (ref 0–37)
Alkaline Phosphatase: 66 U/L (ref 39–117)
Bilirubin, Direct: 0.2 mg/dL (ref 0.0–0.3)
TOTAL PROTEIN: 7.4 g/dL (ref 6.0–8.3)
Total Bilirubin: 1.6 mg/dL — ABNORMAL HIGH (ref 0.2–1.2)

## 2016-05-05 ENCOUNTER — Encounter: Payer: Self-pay | Admitting: Family Medicine

## 2016-05-05 ENCOUNTER — Ambulatory Visit (INDEPENDENT_AMBULATORY_CARE_PROVIDER_SITE_OTHER): Payer: BLUE CROSS/BLUE SHIELD | Admitting: Family Medicine

## 2016-05-05 VITALS — BP 120/78 | HR 56 | Temp 98.2°F | Ht 73.0 in | Wt 220.0 lb

## 2016-05-05 DIAGNOSIS — Z0000 Encounter for general adult medical examination without abnormal findings: Secondary | ICD-10-CM

## 2016-05-05 DIAGNOSIS — M1000 Idiopathic gout, unspecified site: Secondary | ICD-10-CM

## 2016-05-05 DIAGNOSIS — Z0001 Encounter for general adult medical examination with abnormal findings: Secondary | ICD-10-CM

## 2016-05-05 DIAGNOSIS — R400 Somnolence: Secondary | ICD-10-CM

## 2016-05-05 DIAGNOSIS — I10 Essential (primary) hypertension: Secondary | ICD-10-CM

## 2016-05-05 DIAGNOSIS — E785 Hyperlipidemia, unspecified: Secondary | ICD-10-CM

## 2016-05-05 DIAGNOSIS — R0683 Snoring: Secondary | ICD-10-CM

## 2016-05-05 NOTE — Assessment & Plan Note (Signed)
About once a year for 7 days and usually works its way out. Usually Right big toe- has had in foot as well.  States also had in left knee in past with any trauma- was not told gout at times. Discussed stopping chlorthalidone- states he would prefer with infrequency of attacks and good BP control to not change.

## 2016-05-05 NOTE — Progress Notes (Signed)
Pre visit review using our clinic review tool, if applicable. No additional management support is needed unless otherwise documented below in the visit note. 

## 2016-05-05 NOTE — Progress Notes (Signed)
Phone: [phone]  Subjective:  Patient presents today for their annual physical. Chief complaint-noted.   See problem oriented charting- ROS- full  review of systems was completed and negative including No chest pain or shortness of breath. No headache or blurry vision.   The following were reviewed and entered/updated in epic: Past Medical History  Diagnosis Date  . Hypertension   . Gout    Patient Active Problem List   Diagnosis Date Noted  . Hyperlipidemia 05/05/2016    Priority: Medium  . Essential hypertension 01/24/2008    Priority: Medium  . Gout 10/25/2014    Priority: Low   Past Surgical History  Procedure Laterality Date  . Rotator cuff repair      left years ago    Family History  Problem Relation Age of Onset  . Hypertension Mother   . HIV Brother   . Colon cancer Neg Hx   . Pancreatic cancer Neg Hx   . Rectal cancer Neg Hx   . Stomach cancer Neg Hx   . Other Father     unknown- left family when patient age 65    Medications- reviewed and updated Current Outpatient Prescriptions  Medication Sig Dispense Refill  . aspirin 81 MG tablet Take 81 mg by mouth daily.    Marland Kitchen atenolol-chlorthalidone (TENORETIC) 50-25 MG tablet TAKE 1/2 TAB BY MOUTH ONCE DAILY 50 tablet 5  . MULTIPLE VITAMIN PO Take 1 tablet by mouth daily.     No current facility-administered medications for this visit.    Allergies-reviewed and updated No Known Allergies  Social History   Social History  . Marital Status: Married    Spouse Name: N/A  . Number of Children: N/A  . Years of Education: N/A   Social History Main Topics  . Smoking status: Never Smoker   . Smokeless tobacco: Never Used  . Alcohol Use: No  . Drug Use: No  . Sexual Activity: Not Asked   Other Topics Concern  . None   Social History Narrative   Married 38 years in 2017. 2 kids grown. 3 grandkids in 2017.       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 Enjoys vacations and family time    Objective: BP 120/78 mmHg  Pulse 56  Temp(Src) 98.2 F (36.8 C) (Oral)  Ht 6\' 1"  (1.854 m)  Wt 220 lb (99.791 kg)  BMI 29.03 kg/m2  SpO2 97% Gen: NAD, resting comfortably HEENT: Mucous membranes are moist. Oropharynx normal Neck: no thyromegaly CV: RRR no murmurs rubs or gallops Lungs: CTAB no crackles, wheeze, rhonchi Abdomen: soft/nontender/nondistended/normal bowel sounds. No rebound or guarding.  Ext: no edema Skin: warm, dry Neuro: grossly normal, moves all extremities, PERRLA Rectal: normal tone, normal size prostate, no masses or tenderness   Assessment/Plan:  60 y.o. male presenting for annual physical.  Health Maintenance counseling: 1. Anticipatory guidance: Patient counseled regarding regular dental exams, eye exams, wearing seatbelts.  2. Risk factor reduction:  Advised patient of need for regular exercise and diet rich and fruits and vegetables to reduce risk of heart attack and stroke.weight goal under 210 within a year. Active with work- very physical work with [employer].   3. Immunizations/screenings/ancillary studies Immunization History  Administered Date(s) Administered  . Influenza Whole 09/21/2010, 09/06/2011  . Td 01/24/2008   Health Maintenance Due  Topic Date Due  . Hepatitis C Screening - next labs 09/18/1956  .  HIV Screening  - next labs 09/15/1971   4. Prostate cancer screening- low risk based off rectal exam and psa trend. Nocturia once a night but only sleeps 4-5 hours Lab Results  Component Value Date   PSA 1.20 04/28/2016   PSA 1.00 09/07/2014   PSA 0.86 12/06/2012   5. Colon cancer screening - 03/03/14 with 5 year follow up 6. Skin cancer screening- dermatology yearly (cannot recall name). No cancer has been found.   Status of chronic or acute concerns  CBG elevated but had Dr. Malachi Bonds before visit  Snores, has pauses in breathing per wife, daytime sleepiness- refer to pulm  for sleep study consideration  Essential hypertension 1/2 tab atenolol-chlorthalidone 50-25mg  . PVCs in past. Would like to take off diuretic but he declines for now- feels like he has been stable for years- I am concerned about its effect on gout  Gout About once a year for 7 days and usually works its way out. Usually Right big toe- has had in foot as well.  States also had in left knee in past with any trauma- was not told gout at times. Discussed stopping chlorthalidone- states he would prefer with infrequency of attacks and good BP control to not change.   Hyperlipidemia 2017 10.7% 10 year risk per ascvd risk estimator. Prefer risk under 10%. Patient wants to hold off on statin for now and work to get weight in 200-210 ranage and increase exercise then follow up 1 year.    1 year CPE, consider 6 months follow up   Orders Placed This Encounter  Procedures  . Ambulatory referral to Pulmonology    Referral Priority:  Routine    Referral Type:  Consultation    Referral Reason:  Specialty Services Required    Requested Specialty:  Pulmonary Disease    Number of Visits Requested:  1   Return precautions advised.   [name], MD

## 2016-05-05 NOTE — Assessment & Plan Note (Signed)
1/2 tab atenolol-chlorthalidone 50-25mg  . PVCs in past. Would like to take off diuretic but he declines for now- feels like he has been stable for years- I am concerned about its effect on gout

## 2016-05-05 NOTE — Patient Instructions (Signed)
We will call you within a week about your referral to pulmonology for sleep study. If you do not hear within 2 weeks, give [place] a call.   Check in 6 months from now, but definitely at least once a year for physical  If future gout attacks- you agreed to consider changing blood pressure medicine (hydrochlorothiazide) that increases risk for gout attacks  Goal at least 10 lbs weight loss over next year- cholesterol is slightly high and you are in range we could consider cholesterol medicine but you wanted to work on improved diet/exercise first

## 2016-05-05 NOTE — Assessment & Plan Note (Signed)
2017 10.7% 10 year risk per ascvd risk estimator. Prefer risk under 10%. Patient wants to hold off on statin for now and work to get weight in 200-210 ranage and increase exercise then follow up 1 year.

## 2016-07-11 ENCOUNTER — Institutional Professional Consult (permissible substitution): Payer: BLUE CROSS/BLUE SHIELD | Admitting: Pulmonary Disease

## 2016-07-11 ENCOUNTER — Telehealth: Payer: Self-pay | Admitting: Pulmonary Disease

## 2016-07-11 NOTE — Telephone Encounter (Signed)
Pt states he has a sinus infection X1 week, wants to know if he should keep his appt or if he should reschedule.  Pt states he needs to know if we can help with his sinus infection at his sleep consult or if he should reschedule appt to receive treatment at urgent care for sinus infection.

## 2016-07-11 NOTE — Telephone Encounter (Signed)
Dr. Corrie Dandy, please advise if patient can address his Sinus infection at Sleep Consult visit or does he need to go to Urgent Care to address this?  Please advise.

## 2016-07-11 NOTE — Telephone Encounter (Signed)
If he can wait for his appointment to see me to address his sinus issues, I can take care his sinus issues. If not, then maybe go to urgent care soon. Thanks.

## 2016-07-11 NOTE — Telephone Encounter (Signed)
Called spoke with pt. Aware of below. Nothing further needed 

## 2016-10-02 ENCOUNTER — Encounter (HOSPITAL_BASED_OUTPATIENT_CLINIC_OR_DEPARTMENT_OTHER): Payer: Self-pay

## 2016-10-02 DIAGNOSIS — I951 Orthostatic hypotension: Secondary | ICD-10-CM | POA: Diagnosis not present

## 2016-10-02 DIAGNOSIS — E876 Hypokalemia: Secondary | ICD-10-CM | POA: Diagnosis not present

## 2016-10-02 DIAGNOSIS — R197 Diarrhea, unspecified: Secondary | ICD-10-CM | POA: Diagnosis not present

## 2016-10-02 DIAGNOSIS — Z7982 Long term (current) use of aspirin: Secondary | ICD-10-CM | POA: Insufficient documentation

## 2016-10-02 DIAGNOSIS — R531 Weakness: Secondary | ICD-10-CM | POA: Diagnosis present

## 2016-10-02 NOTE — ED Triage Notes (Signed)
Pt c/o diarrhea since Monday, states very weak and passed out tonight while sitting on the toilet.

## 2016-10-03 ENCOUNTER — Emergency Department (HOSPITAL_BASED_OUTPATIENT_CLINIC_OR_DEPARTMENT_OTHER)
Admission: EM | Admit: 2016-10-03 | Discharge: 2016-10-03 | Disposition: A | Discharge status: Home / Self Care | Payer: BLUE CROSS/BLUE SHIELD | Attending: Emergency Medicine | Admitting: Emergency Medicine

## 2016-10-03 ENCOUNTER — Telehealth: Payer: Self-pay | Admitting: Family Medicine

## 2016-10-03 DIAGNOSIS — R197 Diarrhea, unspecified: Secondary | ICD-10-CM

## 2016-10-03 DIAGNOSIS — E876 Hypokalemia: Secondary | ICD-10-CM

## 2016-10-03 DIAGNOSIS — I951 Orthostatic hypotension: Secondary | ICD-10-CM

## 2016-10-03 LAB — CBC WITH DIFFERENTIAL/PLATELET
BASOS ABS: 0 10*3/uL (ref 0.0–0.1)
BASOS PCT: 0 %
Eosinophils Absolute: 0.2 10*3/uL (ref 0.0–0.7)
Eosinophils Relative: 2 %
HEMATOCRIT: 44.3 % (ref 39.0–52.0)
HEMOGLOBIN: 16 g/dL (ref 13.0–17.0)
Lymphocytes Relative: 8 %
Lymphs Abs: 0.7 10*3/uL (ref 0.7–4.0)
MCH: 32.3 pg (ref 26.0–34.0)
MCHC: 36.1 g/dL — ABNORMAL HIGH (ref 30.0–36.0)
MCV: 89.5 fL (ref 78.0–100.0)
Monocytes Absolute: 0.5 10*3/uL (ref 0.1–1.0)
Monocytes Relative: 6 %
NEUTROS ABS: 7.1 10*3/uL (ref 1.7–7.7)
NEUTROS PCT: 84 %
Platelets: 148 10*3/uL — ABNORMAL LOW (ref 150–400)
RBC: 4.95 MIL/uL (ref 4.22–5.81)
RDW: 12.4 % (ref 11.5–15.5)
WBC: 8.5 10*3/uL (ref 4.0–10.5)

## 2016-10-03 LAB — URINALYSIS, ROUTINE W REFLEX MICROSCOPIC
Bilirubin Urine: NEGATIVE
GLUCOSE, UA: NEGATIVE mg/dL
Ketones, ur: NEGATIVE mg/dL
Leukocytes, UA: NEGATIVE
Nitrite: NEGATIVE
PH: 6 (ref 5.0–8.0)
PROTEIN: 30 mg/dL — AB
SPECIFIC GRAVITY, URINE: 1.025 (ref 1.005–1.030)

## 2016-10-03 LAB — COMPREHENSIVE METABOLIC PANEL
ALBUMIN: 4.2 g/dL (ref 3.5–5.0)
ALK PHOS: 69 U/L (ref 38–126)
ALT: 21 U/L (ref 17–63)
AST: 23 U/L (ref 15–41)
Anion gap: 9 (ref 5–15)
BILIRUBIN TOTAL: 1.3 mg/dL — AB (ref 0.3–1.2)
BUN: 14 mg/dL (ref 6–20)
CO2: 28 mmol/L (ref 22–32)
Calcium: 9 mg/dL (ref 8.9–10.3)
Chloride: 99 mmol/L — ABNORMAL LOW (ref 101–111)
Creatinine, Ser: 1.2 mg/dL (ref 0.61–1.24)
GFR calc Af Amer: >60 mL/min (ref >60)
GFR calc non Af Amer: >60 mL/min (ref >60)
GLUCOSE: 145 mg/dL — AB (ref 65–99)
POTASSIUM: 3 mmol/L — AB (ref 3.5–5.1)
Sodium: 136 mmol/L (ref 135–145)
TOTAL PROTEIN: 8 g/dL (ref 6.5–8.1)

## 2016-10-03 LAB — URINE MICROSCOPIC-ADD ON

## 2016-10-03 MED ORDER — POTASSIUM CHLORIDE CRYS ER 20 MEQ PO TBCR
40.0000 meq | EXTENDED_RELEASE_TABLET | Freq: Once | ORAL | Status: AC
  Administered 2016-10-03: 40 meq via ORAL
  Filled 2016-10-03: qty 2

## 2016-10-03 MED ORDER — LOPERAMIDE HCL 2 MG PO CAPS
4.0000 mg | ORAL_CAPSULE | Freq: Once | ORAL | Status: AC
  Administered 2016-10-03: 4 mg via ORAL
  Filled 2016-10-03: qty 2

## 2016-10-03 MED ORDER — ONDANSETRON HCL 4 MG/2ML IJ SOLN
4.0000 mg | Freq: Once | INTRAMUSCULAR | Status: AC
  Administered 2016-10-03: 4 mg via INTRAVENOUS
  Filled 2016-10-03: qty 2

## 2016-10-03 MED ORDER — SODIUM CHLORIDE 0.9 % IV BOLUS (SEPSIS)
1000.0000 mL | Freq: Once | INTRAVENOUS | Status: AC
  Administered 2016-10-03: 1000 mL via INTRAVENOUS

## 2016-10-03 MED ORDER — POTASSIUM CHLORIDE CRYS ER 20 MEQ PO TBCR: 20.0000 meq | EXTENDED_RELEASE_TABLET | Freq: Two times a day (BID) | ORAL | 0 refills | Status: DC

## 2016-10-03 MED ORDER — ONDANSETRON 8 MG PO TBDP: 8.0000 mg | ORAL_TABLET | Freq: Three times a day (TID) | ORAL | 0 refills | Status: DC | PRN

## 2016-10-03 NOTE — ED Notes (Signed)
C/o diarrhea x 3-4 days  Has been on antibiotics for sinus infection

## 2016-10-03 NOTE — ED Provider Notes (Signed)
[facility] Provider Note: Georgena Spurling, MD, FACEP  CSN: [id number] MRN: [id number] ARRIVAL: 10/02/16 at [place]: [place] is a 60 y.o. male who was put on amoxicillin 5 days ago for a sinus infection. He has taken antibiotics in the past without difficulty. He is here with diarrhea for the past 48 hours. His diarrhea has been profuse, watery and brown. There is been no associated bleeding. He has had nausea but no vomiting. He has had increased bowel rumbling but no abdominal pain. Yesterday evening he became weak and lightheaded and had a syncopal episode. He was diaphoretic afterward but felt better.   Past Medical History:  Diagnosis Date  . Gout   . Hypertension     Past Surgical History:  Procedure Laterality Date  . ROTATOR CUFF REPAIR     left years ago    Family History  Problem Relation Age of Onset  . Hypertension Mother   . HIV Brother   . Other Father     unknown- left family when patient age 25  . Colon cancer Neg Hx   . Pancreatic cancer Neg Hx   . Rectal cancer Neg Hx   . Stomach cancer Neg Hx     Social History  Substance Use Topics  . Smoking status: Never Smoker  . Smokeless tobacco: Never Used  . Alcohol use No    Prior to Admission medications   Medication Sig Start Date End Date Taking? Authorizing Provider  aspirin 81 MG tablet Take 81 mg by mouth daily.    Historical Provider, MD  atenolol-chlorthalidone (TENORETIC) 50-25 MG tablet TAKE 1/2 TAB BY MOUTH ONCE DAILY 12/31/15   Dorena Cookey, MD  MULTIPLE VITAMIN PO Take 1 tablet by mouth daily.    Historical Provider, MD  ondansetron (ZOFRAN ODT) 8 MG disintegrating tablet Take 1 tablet (8 mg total) by mouth every 8 (eight) hours as needed for nausea or vomiting. 10/03/16   Lorayne Getchell, MD  potassium chloride SA (K-DUR,KLOR-CON) 20 MEQ tablet Take 1 tablet (20 mEq total) by mouth 2 (two) times daily.  10/03/16   Shanon Rosser, MD    Allergies Patient has no known allergies.   REVIEW OF SYSTEMS  Negative except as noted here or in the History of Present Illness.   PHYSICAL EXAMINATION  Initial Vital Signs Blood pressure (!) 157/136, pulse 80, temperature 98.2 F (36.8 C), temperature source Oral, resp. rate 18, height 6\' 1"  (1.854 m), weight 206 lb (93.4 kg), SpO2 92 %.  Examination General: Well-developed, well-nourished male in no acute distress; appearance consistent with age of record HENT: normocephalic; atraumatic Eyes: pupils equal, round and reactive to light; extraocular muscles intact Neck: supple Heart: regular rate and rhythm Lungs: clear to auscultation bilaterally Abdomen: soft; nondistended; nontender; no masses or hepatosplenomegaly; bowel sounds present Extremities: No deformity; full range of motion; pulses normal Neurologic: Awake, alert and oriented; motor function intact in all extremities and symmetric; no facial droop Skin: Warm and dry Psychiatric: Normal mood and affect   RESULTS  Summary of this visit's results, reviewed by myself:   EKG Interpretation  Date/Time:    Ventricular Rate:    PR Interval:    QRS Duration:   QT Interval:    QTC Calculation:   R Axis:     Text Interpretation:        Laboratory Studies: Results for orders placed or  performed during the hospital encounter of 10/03/16 (from the past 24 hour(s))  CBC with Differential     Status: Abnormal   Collection Time: 10/03/16 12:30 AM  Result Value Ref Range   WBC 8.5 4.0 - 10.5 K/uL   RBC 4.95 4.22 - 5.81 MIL/uL   Hemoglobin 16.0 13.0 - 17.0 g/dL   HCT 44.3 39.0 - 52.0 %   MCV 89.5 78.0 - 100.0 fL   MCH 32.3 26.0 - 34.0 pg   MCHC 36.1 (H) 30.0 - 36.0 g/dL   RDW 12.4 11.5 - 15.5 %   Platelets 148 (L) 150 - 400 K/uL   Neutrophils Relative % 84 %   Neutro Abs 7.1 1.7 - 7.7 K/uL   Lymphocytes Relative 8 %   Lymphs Abs 0.7 0.7 - 4.0 K/uL   Monocytes Relative 6 %    Monocytes Absolute 0.5 0.1 - 1.0 K/uL   Eosinophils Relative 2 %   Eosinophils Absolute 0.2 0.0 - 0.7 K/uL   Basophils Relative 0 %   Basophils Absolute 0.0 0.0 - 0.1 K/uL  Comprehensive metabolic panel     Status: Abnormal   Collection Time: 10/03/16 12:30 AM  Result Value Ref Range   Sodium 136 135 - 145 mmol/L   Potassium 3.0 (L) 3.5 - 5.1 mmol/L   Chloride 99 (L) 101 - 111 mmol/L   CO2 28 22 - 32 mmol/L   Glucose, Bld 145 (H) 65 - 99 mg/dL   BUN 14 6 - 20 mg/dL   Creatinine, Ser 1.20 0.61 - 1.24 mg/dL   Calcium 9.0 8.9 - 10.3 mg/dL   Total Protein 8.0 6.5 - 8.1 g/dL   Albumin 4.2 3.5 - 5.0 g/dL   AST 23 15 - 41 U/L   ALT 21 17 - 63 U/L   Alkaline Phosphatase 69 38 - 126 U/L   Total Bilirubin 1.3 (H) 0.3 - 1.2 mg/dL   GFR calc non Af Amer >60 >60 mL/min   GFR calc Af Amer >60 >60 mL/min   Anion gap 9 5 - 15  Urinalysis, Routine w reflex microscopic (not at [facility])     Status: Abnormal   Collection Time: 10/03/16 12:38 AM  Result Value Ref Range   Color, Urine AMBER (A) YELLOW   APPearance CLOUDY (A) CLEAR   Specific Gravity, Urine 1.025 1.005 - 1.030   pH 6.0 5.0 - 8.0   Glucose, UA NEGATIVE NEGATIVE mg/dL   Hgb urine dipstick SMALL (A) NEGATIVE   Bilirubin Urine NEGATIVE NEGATIVE   Ketones, ur NEGATIVE NEGATIVE mg/dL   Protein, ur 30 (A) NEGATIVE mg/dL   Nitrite NEGATIVE NEGATIVE   Leukocytes, UA NEGATIVE NEGATIVE  Urine microscopic-add on     Status: Abnormal   Collection Time: 10/03/16 12:38 AM  Result Value Ref Range   Squamous Epithelial / LPF 0-5 (A) NONE SEEN   WBC, UA 0-5 0 - 5 WBC/hpf   RBC / HPF 0-5 0 - 5 RBC/hpf   Bacteria, UA FEW (A) NONE SEEN   Casts HYALINE CASTS (A) NEGATIVE   Urine-Other MUCOUS PRESENT    Imaging Studies: No results found.  ED COURSE  Nursing notes and initial vitals signs, including pulse oximetry, reviewed.  Vitals:   10/03/16 0000  BP: (!) 157/136  Pulse: 80  Resp: 18  Temp: 98.2 F (36.8 C)  TempSrc: Oral  SpO2: 92%    Weight: 206 lb (93.4 kg)  Height: 6\' 1"  (1.854 m)   1:58 AM Feeling better after  IV fluids and medications. He was advised to use over-the-counter Imodium as needed per package instructions. We will provide prescriptions for Zofran and potassium.  PROCEDURES    ED DIAGNOSES     ICD-9-CM ICD-10-CM   1. Diarrhea in adult patient 787.91 R19.7   2. Syncope due to orthostatic hypotension 458.0 I95.1   3. Hypokalemia due to loss of potassium 276.8 E87.6        Shanon Rosser, MD 10/03/16 0200

## 2016-10-03 NOTE — Telephone Encounter (Signed)
Pt would like to have his potassium checked went to the ER and they state that his potassium is low and need to have it checked within 5 days from 10/02/16

## 2016-10-06 NOTE — Telephone Encounter (Signed)
Ok to order under hypokalemia

## 2016-10-07 ENCOUNTER — Other Ambulatory Visit: Payer: Self-pay

## 2016-10-07 DIAGNOSIS — E876 Hypokalemia: Secondary | ICD-10-CM

## 2016-10-07 NOTE — Telephone Encounter (Signed)
Order entered in computer and appointment scheduled

## 2016-10-09 ENCOUNTER — Other Ambulatory Visit (INDEPENDENT_AMBULATORY_CARE_PROVIDER_SITE_OTHER): Payer: BLUE CROSS/BLUE SHIELD

## 2016-10-09 DIAGNOSIS — E876 Hypokalemia: Secondary | ICD-10-CM

## 2016-10-09 LAB — POTASSIUM: POTASSIUM: 3.5 meq/L (ref 3.5–5.1)

## 2016-10-30 ENCOUNTER — Telehealth (INDEPENDENT_AMBULATORY_CARE_PROVIDER_SITE_OTHER): Payer: Self-pay | Admitting: Orthopaedic Surgery

## 2016-10-30 NOTE — Telephone Encounter (Signed)
See message below °

## 2016-10-30 NOTE — Telephone Encounter (Signed)
Pt requesting refill of gout medication. He does not know what its called. Pt number is [phone]

## 2016-10-30 NOTE — Telephone Encounter (Signed)
He should contact his PCP.

## 2016-10-31 ENCOUNTER — Ambulatory Visit (INDEPENDENT_AMBULATORY_CARE_PROVIDER_SITE_OTHER): Payer: BLUE CROSS/BLUE SHIELD | Admitting: Family Medicine

## 2016-10-31 ENCOUNTER — Encounter: Payer: Self-pay | Admitting: Family Medicine

## 2016-10-31 VITALS — BP 131/87 | HR 63 | Temp 98.0°F | Resp 20 | Wt 214.8 lb

## 2016-10-31 DIAGNOSIS — M1000 Idiopathic gout, unspecified site: Secondary | ICD-10-CM

## 2016-10-31 DIAGNOSIS — M25561 Pain in right knee: Secondary | ICD-10-CM | POA: Diagnosis not present

## 2016-10-31 MED ORDER — PREDNISONE 20 MG PO TABS: ORAL_TABLET | ORAL | 0 refills | Status: DC

## 2016-10-31 MED ORDER — METHYLPREDNISOLONE ACETATE 80 MG/ML IJ SUSP
80.0000 mg | Freq: Once | INTRAMUSCULAR | Status: AC
  Administered 2016-10-31: 80 mg via INTRAMUSCULAR

## 2016-10-31 NOTE — Telephone Encounter (Signed)
LMOM for patient of the below message  

## 2016-10-31 NOTE — Progress Notes (Signed)
Garrett Avila , 10-10-1956, 60 y.o., male MRN: [id number] Patient Care Team    Relationship Specialty Notifications Start End  Marin Olp, MD PCP - General Family Medicine  05/05/16     CC: Right knee pain Subjective: Pt presents for an acute OV with complaints of right knee pain of few days duration.  Associated symptoms include mild swelling. Patient reports the pain is worse when he attempts to bend his knee. The pain is located superiorly  to the kneecap. Patient denies fevers or chills. He reports history of gout flares approximately 2 times a year, including in this location. He has had an elevated uric acid in the past. He has attempted daily prophylactic treatment, but has decided not to continue using considering his decreased number of flares. He was treated with steroid dose pack in the past with good resolution of his symptoms. He had started taking 800 mg twice a day of ibuprofen 2 days ago and has started to help with the discomfort. He reports he did mildly hit his knee on the corner of furniture piece a few days prior to this occurring.  No Known Allergies Social History  Substance Use Topics  . Smoking status: Never Smoker  . Smokeless tobacco: Never Used  . Alcohol use No   Past Medical History:  Diagnosis Date  . Gout   . Hypertension    Past Surgical History:  Procedure Laterality Date  . ROTATOR CUFF REPAIR     left years ago   Family History  Problem Relation Age of Onset  . Hypertension Mother   . HIV Brother   . Other Father     unknown- left family when patient age 89  . Colon cancer Neg Hx   . Pancreatic cancer Neg Hx   . Rectal cancer Neg Hx   . Stomach cancer Neg Hx    Allergies as of 10/31/2016   No Known Allergies     Medication List       Accurate as of 10/31/16  3:37 PM. Always use your most recent med list.          aspirin 81 MG tablet Take 81 mg by mouth daily.   atenolol-chlorthalidone 50-25 MG tablet Commonly known  as:  TENORETIC TAKE 1/2 TAB BY MOUTH ONCE DAILY   MULTIPLE VITAMIN PO Take 1 tablet by mouth daily.   ondansetron 8 MG disintegrating tablet Commonly known as:  ZOFRAN ODT Take 1 tablet (8 mg total) by mouth every 8 (eight) hours as needed for nausea or vomiting.   potassium chloride SA 20 MEQ tablet Commonly known as:  K-DUR,KLOR-CON Take 1 tablet (20 mEq total) by mouth 2 (two) times daily.       No results found for this or any previous visit (from the past 24 hour(s)). No results found.   ROS: Negative, with the exception of above mentioned in HPI   Objective:  BP 131/87 (BP Location: Right Arm, Patient Position: Sitting, Cuff Size: Large)   Pulse 63   Temp 98 F (36.7 C)   Resp 20   Wt 214 lb 12 oz (97.4 kg)   SpO2 98%   BMI 28.33 kg/m  Body mass index is 28.33 kg/m. Gen: Afebrile. No acute distress. Nontoxic in appearance, well developed, well nourished.  HENT: AT. [place]. MMM, no oral lesions.  Eyes:Pupils Equal Round Reactive to light, Extraocular movements intact,  Conjunctiva without redness, discharge or icterus. Right knee: Mild erythema anterior/superior right knee.  Mild soft tissue swelling. Tender to palpation superior to knee. Moderate discomfort to flexion. Neurovascular intact distally. Skin: No bruising. No rashes, purpura or petechiae. Skin intact Neuro: Mild limping.  Alert. Oriented x3   Assessment/Plan: Garrett Avila is a 60 y.o. male present for acute OV for  Acute pain of right knee/ Idiopathic gout, unspecified chronicity, unspecified site - Considered knee injection today, however area of concern is not actually located in the joint space. Patient was provided with different treatment options, and decided on IM Depo-Medrol injection, with prednisone taper to start tomorrow. He is tolerating pain okay, states as long as he doesn't try to bend it he is not in any pain. He is seeing resolution of pain with ibuprofen. We'll collect labs today to  rule out infectious causes/bursitis. He states this seems to be consistent with his prior gout flare. - Uric acid - CBC w/Diff - methylPREDNISolone acetate (DEPO-MEDROL) injection 80 mg; Inject 1 mL (80 mg total) into the muscle once. - Follow-up 2 weeks if not resolved, sooner if worsening.    electronically signed by:  Howard Pouch, DO  [place]

## 2016-10-31 NOTE — Patient Instructions (Signed)
Steroid injection today, and prednisone called to start tomorrow as directed.   We will call you with labs results on Tuesday.    This appears to be gout.

## 2016-11-04 ENCOUNTER — Telehealth: Payer: Self-pay | Admitting: Family Medicine

## 2016-11-04 LAB — CBC WITH DIFFERENTIAL/PLATELET

## 2016-11-04 LAB — URIC ACID: Uric Acid, Serum: 6.7 mg/dL (ref 4.0–8.0)

## 2016-11-04 NOTE — Telephone Encounter (Signed)
Patient notified and verbalized understanding.  Patient stated that he is feeling "a lot better".

## 2016-11-04 NOTE — Telephone Encounter (Signed)
Please call pt: - his uric acid levels were high normal.  - if his symptoms/condition is improving continue current therapy. If not improving or worsening he should be seen immediately.  - please ask how he is doing, since his CBC was unable to be ran 2/2 to lab error.

## 2016-11-10 ENCOUNTER — Telehealth: Payer: Self-pay | Admitting: Family Medicine

## 2016-11-10 ENCOUNTER — Telehealth: Payer: Self-pay

## 2016-11-10 ENCOUNTER — Other Ambulatory Visit: Payer: Self-pay

## 2016-11-10 MED ORDER — PREDNISONE 20 MG PO TABS: ORAL_TABLET | ORAL | 0 refills | Status: DC

## 2016-11-10 NOTE — Telephone Encounter (Signed)
Patient scheduled for Thursday, 11/13/2016 @ 11:30am.

## 2016-11-10 NOTE — Telephone Encounter (Signed)
Spoke with patient and let him know that we sent a prescription in for him (for gout) and that his pharmacy should be calling him to let him know. Patient verbalized understanding.

## 2016-11-10 NOTE — Telephone Encounter (Signed)
Left message for patient letting him know Dr Raoul Pitch advised at his visit to follow up with Dr if no improvement in his symptoms.

## 2016-11-10 NOTE — Telephone Encounter (Signed)
Patient advised to see PCP.

## 2016-11-10 NOTE — Telephone Encounter (Signed)
Patient was seen 10/31/16. His knee is still swollen & hurting. Please advise

## 2016-11-10 NOTE — Telephone Encounter (Signed)
Patient requesting RX refill on gout medication  Pharmacy: [facility]: [phone]

## 2016-11-13 ENCOUNTER — Encounter: Payer: Self-pay | Admitting: Family Medicine

## 2016-11-13 ENCOUNTER — Ambulatory Visit (INDEPENDENT_AMBULATORY_CARE_PROVIDER_SITE_OTHER): Payer: BLUE CROSS/BLUE SHIELD | Admitting: Family Medicine

## 2016-11-13 DIAGNOSIS — I10 Essential (primary) hypertension: Secondary | ICD-10-CM

## 2016-11-13 DIAGNOSIS — M1000 Idiopathic gout, unspecified site: Secondary | ICD-10-CM | POA: Diagnosis not present

## 2016-11-13 MED ORDER — ATENOLOL 25 MG PO TABS: 25.0000 mg | ORAL_TABLET | Freq: Every day | ORAL | 3 refills | Status: DC

## 2016-11-13 MED ORDER — AMLODIPINE BESYLATE 5 MG PO TABS: 5.0000 mg | ORAL_TABLET | Freq: Every day | ORAL | 3 refills | Status: DC

## 2016-11-13 NOTE — Progress Notes (Signed)
Subjective:  Garrett Avila is a 61 y.o. year old very pleasant male patient who presents for/with See problem oriented charting ROS- No chest pain or shortness of breath. No headache or blurry vision. Did have recent warmth, swelling, pain in right knee   Past Medical History-  Patient Active Problem List   Diagnosis Date Noted  . Hyperlipidemia 05/05/2016    Priority: Medium  . Essential hypertension 01/24/2008    Priority: Medium  . Gout 10/25/2014    Priority: Low  . Acute pain of right knee 10/31/2016    Medications- reviewed and updated Current Outpatient Prescriptions  Medication Sig Dispense Refill  . aspirin 81 MG tablet Take 81 mg by mouth daily.    . MULTIPLE VITAMIN PO Take 1 tablet by mouth daily.    . predniSONE (DELTASONE) 20 MG tablet 60 mg x3d, 40 mg x3d, 20 mg x2d, 10 mg x2d 18 tablet 0  . amLODipine (NORVASC) 5 MG tablet Take 1 tablet (5 mg total) by mouth daily. 90 tablet 3  . atenolol (TENORMIN) 25 MG tablet Take 1 tablet (25 mg total) by mouth daily. 90 tablet 3   No current facility-administered medications for this visit.     Objective: BP 118/76 (BP Location: Left Arm, Patient Position: Sitting, Cuff Size: Large)   Pulse 67   Temp 97.9 F (36.6 C) (Oral)   Ht 6\' 1"  (1.854 m)   Wt 218 lb 9.6 oz (99.2 kg)   SpO2 95%   BMI 28.84 kg/m  Gen: NAD, resting comfortably CV: RRR no murmurs rubs or gallops Lungs: CTAB no crackles, wheeze, rhonchi MSK: right knee with minimal swelling, also minimal pain on exam  Ext: no edema Skin: warm, dry  Assessment/Plan:  Gout S: gout flares about once a year for 7 days. Usually right big toe. Also extreme sensitivity to trauama at knees and with trauma knees will swell and become very warm.   We had discussed previously stopping chlorthalidone but he had declined.   Uric acid 8.3 years ago down to 6.7 thrteen days ago but in flare at time though.   10/31/16. Seen for R knee pain after hitting right knee by  Dr. Raoul Pitch and determined likely gout- given  Depo medrol IM and prednisone  10 day prednisone. Usually flares after hitting knee. Hit again 3 days ago and we called in prednisone again 90% better.  A/P: 61 year old with gout with recent flare. We discussed uric acid lowering agent and he firmly declines. Does agree to stop chlorthalidone (see BP section) . Get uric acid when not in flare. If still having flares and level above 6 for uric acid- he will reconsider preventative   Essential hypertension S: controlled on 1/2 tab atenolol-chlorthalidone 50-25mg .  BP Readings from Last 3 Encounters:  11/13/16 118/76  10/31/16 131/87  10/03/16 114/73  A/P: stop chlorthalidone due to gout. Start atenolol 25mg  and amlodipine 5mg  once daily. Follow up 1-2 months for BP recheck.   1-2 months  Meds ordered this encounter  Medications  . atenolol (TENORMIN) 25 MG tablet    Sig: Take 1 tablet (25 mg total) by mouth daily.    Dispense:  90 tablet    Refill:  3  . amLODipine (NORVASC) 5 MG tablet    Sig: Take 1 tablet (5 mg total) by mouth daily.    Dispense:  90 tablet    Refill:  3    Return precautions advised.  Garrett Reddish, MD

## 2016-11-13 NOTE — Assessment & Plan Note (Signed)
S: controlled on 1/2 tab atenolol-chlorthalidone 50-25mg .  BP Readings from Last 3 Encounters:  11/13/16 118/76  10/31/16 131/87  10/03/16 114/73  A/P: stop chlorthalidone due to gout. Start atenolol 25mg  and amlodipine 5mg  once daily. Follow up 1-2 months for BP recheck.

## 2016-11-13 NOTE — Assessment & Plan Note (Signed)
S: gout flares about once a year for 7 days. Usually right big toe. Also extreme sensitivity to trauama at knees and with trauma knees will swell and become very warm.   We had discussed previously stopping chlorthalidone but he had declined.   Uric acid 8.3 years ago down to 6.7 thrteen days ago but in flare at time though.   10/31/16. Seen for R knee pain after hitting right knee by Dr. Raoul Pitch and determined likely gout- given  Depo medrol IM and prednisone  10 day prednisone. Usually flares after hitting knee. Hit again 3 days ago and we called in prednisone again 90% better.  A/P: 61 year old with gout with recent flare. We discussed uric acid lowering agent and he firmly declines. Does agree to stop chlorthalidone (see BP section) . Get uric acid when not in flare. If still having flares and level above 6 for uric acid- he will reconsider preventative

## 2016-11-13 NOTE — Progress Notes (Signed)
Pre visit review using our clinic review tool, if applicable. No additional management support is needed unless otherwise documented below in the visit note. 

## 2016-11-13 NOTE — Patient Instructions (Signed)
Stop atenolol-chlorthalidone (current combo pill)  Start both amlodipine 5mg  and atenolol 25mg  in the morning (full pill)  Lets follow up in office 1-2 months from now to recheck.   Garrett Avila will sign you up for mychart before you leave  Hopefully this cuts down on gout flares- if not we will have to consider medication in the future.

## 2016-12-02 ENCOUNTER — Ambulatory Visit (INDEPENDENT_AMBULATORY_CARE_PROVIDER_SITE_OTHER): Payer: BLUE CROSS/BLUE SHIELD | Admitting: Orthopaedic Surgery

## 2016-12-02 ENCOUNTER — Ambulatory Visit (INDEPENDENT_AMBULATORY_CARE_PROVIDER_SITE_OTHER): Payer: Self-pay

## 2016-12-02 DIAGNOSIS — M25561 Pain in right knee: Secondary | ICD-10-CM | POA: Diagnosis not present

## 2016-12-02 DIAGNOSIS — G8929 Other chronic pain: Secondary | ICD-10-CM

## 2016-12-02 MED ORDER — PREDNISONE 10 MG (21) PO TBPK: ORAL_TABLET | ORAL | 0 refills | Status: DC

## 2016-12-02 NOTE — Progress Notes (Signed)
   Office Visit Note   Patient: Garrett Avila           Date of Birth: Dec 19, 1955           MRN: [id number] Visit Date: 12/02/2016              Requested by: Marin Olp, MD [place], [place] [address] PCP: [name], MD   Assessment & Plan: Visit Diagnoses:  1. Chronic pain of right knee     Plan: Prednisone taper prescribed. Also recommended compression and ice and rest. Kneepads at work. Follow-up with me as needed.  Follow-Up Instructions: Return if symptoms worsen or fail to improve.   Orders:  Orders Placed This Encounter  Procedures  . XR KNEE 3 VIEW RIGHT   Meds ordered this encounter  Medications  . predniSONE (STERAPRED UNI-PAK 21 TAB) 10 MG (21) TBPK tablet    Sig: Take as directed    Dispense:  21 tablet    Refill:  0      Procedures: No procedures performed   Clinical Data: No additional findings.   Subjective: Chief Complaint  Patient presents with  . Right Knee - Pain    Patient is a 61 year old, is well-known to me. He comes in with right knee pain over the prepatellar bursa. He did strike his knee-ago and swelling getting better and is worse with trying to heal flex his knee. He denies any constitutional symptoms. Denies any mechanical symptoms.    Review of Systems   Objective: Vital Signs: There were no vitals taken for this visit.  Physical Exam  Constitutional: He is oriented to person, place, and time. He appears well-developed and well-nourished.  Pulmonary/Chest: Effort normal.  Abdominal: Soft.  Neurological: He is alert and oriented to person, place, and time.  Skin: Skin is warm.  Psychiatric: He has a normal mood and affect. His behavior is normal. Judgment and thought content normal.  Nursing note and vitals reviewed.   Ortho Exam Exam of the right knee shows no significant joint effusion. He has swelling of the prepatellar bursa. There is no signs of infection. This is slightly tender to  palpation. Range of motion is fairly normal with some discomfort anteriorly with terminal flexion. Specialty Comments:  No specialty comments available.  Imaging: Xr Knee 3 View Right  Result Date: 12/02/2016 No acute findings    PMFS History: Patient Active Problem List   Diagnosis Date Noted  . Acute pain of right knee 10/31/2016  . Hyperlipidemia 05/05/2016  . Gout 10/25/2014  . Essential hypertension 01/24/2008   Past Medical History:  Diagnosis Date  . Gout   . Hypertension     Family History  Problem Relation Age of Onset  . Hypertension Mother   . HIV Brother   . Other Father     unknown- left family when patient age 61  . Colon cancer Neg Hx   . Pancreatic cancer Neg Hx   . Rectal cancer Neg Hx   . Stomach cancer Neg Hx     Past Surgical History:  Procedure Laterality Date  . ROTATOR CUFF REPAIR     left years ago   Social History   Occupational History  . Not on file.   Social History Main Topics  . Smoking status: Never Smoker  . Smokeless tobacco: Never Used  . Alcohol use No  . Drug use: No  . Sexual activity: Not on file

## 2016-12-29 ENCOUNTER — Encounter: Payer: Self-pay | Admitting: Family Medicine

## 2016-12-29 ENCOUNTER — Ambulatory Visit (INDEPENDENT_AMBULATORY_CARE_PROVIDER_SITE_OTHER): Payer: BLUE CROSS/BLUE SHIELD | Admitting: Family Medicine

## 2016-12-29 VITALS — BP 124/82 | HR 60 | Temp 97.9°F | Ht 73.0 in | Wt 220.0 lb

## 2016-12-29 DIAGNOSIS — I10 Essential (primary) hypertension: Secondary | ICD-10-CM | POA: Diagnosis not present

## 2016-12-29 DIAGNOSIS — M1000 Idiopathic gout, unspecified site: Secondary | ICD-10-CM | POA: Diagnosis not present

## 2016-12-29 MED ORDER — PREDNISONE 20 MG PO TABS: ORAL_TABLET | ORAL | 0 refills | Status: DC

## 2016-12-29 NOTE — Progress Notes (Signed)
Pre visit review using our clinic review tool, if applicable. No additional management support is needed unless otherwise documented below in the visit note. 

## 2016-12-29 NOTE — Assessment & Plan Note (Signed)
S: controlled on atenolol 25mg  and amlodipine 5mg  . In past--> 1/2 tab atenolol-chlorthalidone 50-25mg  . Stop chlorthalidone due to gout. Opted to keep atenolol with history of PVCs in past BP Readings from Last 3 Encounters:  12/29/16 124/82  11/13/16 118/76  10/31/16 131/87  A/P:Continue current meds:  Doing very well, no side effects

## 2016-12-29 NOTE — Patient Instructions (Signed)
Things look great. No changes.   We will reconsider medicine to prevent gout if you start having flares but glad you have not

## 2016-12-29 NOTE — Assessment & Plan Note (Addendum)
S: no gout flares since stopping chlorthalidone. On other hand- has not "bumped anything" which he states trauma often a trigger.  A/P: does not want prophylactic unless frequent flares- monitor only for now  Did ask to have prednisone on hand for upcoming cruise in case flares. May want to trial something along lined indomethacin or colcrys in future- but with potential of happen with being on cruise ship did not opt to change for now

## 2016-12-29 NOTE — Progress Notes (Addendum)
Subjective:  Garrett Avila is a 61 y.o. year old very pleasant male patient who presents for/with See problem oriented charting ROS- No chest pain or shortness of breath. No headache or blurry vision. No recent hot/swollen joints   Past Medical History-  Patient Active Problem List   Diagnosis Date Noted  . Hyperlipidemia 05/05/2016    Priority: Medium  . Essential hypertension 01/24/2008    Priority: Medium  . Gout 10/25/2014    Priority: Low  . Acute pain of right knee 10/31/2016    Medications- reviewed and updated Current Outpatient Prescriptions  Medication Sig Dispense Refill  . amLODipine (NORVASC) 5 MG tablet Take 1 tablet (5 mg total) by mouth daily. 90 tablet 3  . aspirin 81 MG tablet Take 81 mg by mouth daily.    Marland Kitchen atenolol (TENORMIN) 25 MG tablet Take 1 tablet (25 mg total) by mouth daily. 90 tablet 3  . MULTIPLE VITAMIN PO Take 1 tablet by mouth daily.    . predniSONE (DELTASONE) 20 MG tablet 60 mg x3d, 40 mg x3d, 20 mg x2d, 10 mg x2d 18 tablet 0   Objective: BP 124/82 (BP Location: Left Arm, Patient Position: Sitting, Cuff Size: Large)   Pulse 60   Temp 97.9 F (36.6 C) (Oral)   Ht 6\' 1"  (1.854 m)   Wt 220 lb (99.8 kg)   SpO2 95%   BMI 29.03 kg/m  Gen: NAD, resting comfortably CV: RRR no murmurs rubs or gallops Lungs: CTAB no crackles, wheeze, rhonchi  Ext: no edema, no hot, swollen joints Skin: warm, dry  Assessment/Plan:  Essential hypertension S: controlled on atenolol 25mg  and amlodipine 5mg  . In past--> 1/2 tab atenolol-chlorthalidone 50-25mg  . Stop chlorthalidone due to gout. Opted to keep atenolol with history of PVCs in past BP Readings from Last 3 Encounters:  12/29/16 124/82  11/13/16 118/76  10/31/16 131/87  A/P:Continue current meds:  Doing very well, no side effects  Gout S: no gout flares since stopping chlorthalidone. On other hand- has not "bumped anything" which he states trauma often a trigger.  A/P: does not want prophylactic  unless frequent flares- monitor only for now  No regularly scheduled visit was planned today.   Declined uric acid lab today  Meds ordered this encounter  Medications  . predniSONE (DELTASONE) 20 MG tablet    Sig: 60 mg x3d, 40 mg x3d, 20 mg x2d, 10 mg x2d    Dispense:  18 tablet    Refill:  0   Return precautions advised.  [name], MD

## 2017-02-20 ENCOUNTER — Encounter: Payer: Self-pay | Admitting: Pulmonary Disease

## 2017-02-20 ENCOUNTER — Ambulatory Visit (INDEPENDENT_AMBULATORY_CARE_PROVIDER_SITE_OTHER): Payer: BLUE CROSS/BLUE SHIELD | Admitting: Pulmonary Disease

## 2017-02-20 VITALS — BP 118/62 | HR 64 | Ht 73.0 in | Wt 220.4 lb

## 2017-02-20 DIAGNOSIS — G4710 Hypersomnia, unspecified: Secondary | ICD-10-CM | POA: Diagnosis not present

## 2017-02-20 NOTE — Assessment & Plan Note (Signed)
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  He ends up sleeping from 6:30 pm until 11 pm.  He has been doing [employer] work for 43 yrs.  He does this schedule during weekdays. On weekends, he sleeps at the same time as the wife. Around 9 pm and wakes up at 6-7 am.   He has snoring, witnessed apneas, gasping, choking.  He has hypersomnia and unrefreshed sleep every now and then. He denies getting sleepy at work.  He denies abnormal  behavior in sleep.  Hypersomnia can affect his functionality.   ESS 7.   Plan :  We discussed about the diagnosis of Obstructive Sleep Apnea (OSA) and implications of untreated OSA. We discussed about CPAP and BiPaP as possible treatment options.    We will schedule the patient for a sleep study. Plan for a HST. Need to call pt with results.  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  He works for [employer], night shift   Patient was instructed to call the office if he/she has not heard back from the office 1-2 weeks after the sleep study.   Patient was instructed to call the office if he/she is having issues with the PAP device.   We discussed good sleep hygiene.   Patient was advised not to engage in activities requiring concentration and/or vigilance if he/she is sleepy.  Patient was advised not to drive if he/she is sleepy.

## 2017-02-20 NOTE — Patient Instructions (Signed)

## 2017-02-20 NOTE — Progress Notes (Signed)
Subjective:    Patient ID: Garrett Avila, male    DOB: December 15, 1955, 61 y.o.   MRN: [id number]  HPI   This is the case of Garrett Avila, 61 y.o. Male, who was referred by Dr. [name] in consultation regarding possible OSA.   As you very well know, patient is a non smoker, not known to have asthma or copd.   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  He ends up sleeping from 6:30 pm until 11 pm.  He has been doing [employer] work for 43 yrs.  He does this schedule during weekdays. On weekends, he sleeps at the same time as the wife. Around 9 pm and wakes up at 6-7 am.   He has snoring, witnessed apneas, gasping, choking.  He has hypersomnia and unrefreshed sleep every now and then. He denies getting sleepy at work.  He denies abnormal  behavior in sleep.  Hypersomnia can affect his functionality.   ESS 7.    Review of Systems  Constitutional: Negative.  Negative for fever and unexpected weight change.  HENT: Negative.  Negative for congestion, dental problem, ear pain, nosebleeds, postnasal drip, rhinorrhea, sinus pressure, sneezing, sore throat and trouble swallowing.   Eyes: Negative.  Negative for redness and itching.  Respiratory: Negative.  Negative for cough, chest tightness, shortness of breath and wheezing.   Cardiovascular: Negative.  Negative for palpitations and leg swelling.  Gastrointestinal: Negative.  Negative for nausea and vomiting.  Endocrine: Negative.   Genitourinary: Negative.  Negative for dysuria.  Musculoskeletal: Negative.  Negative for joint swelling.  Skin: Negative.  Negative for rash.  Allergic/Immunologic: Negative.  Negative for environmental allergies, food allergies and immunocompromised state.  Neurological: Negative.  Negative for headaches.  Hematological: Negative.  Does not bruise/bleed easily.  Psychiatric/Behavioral: Negative.  Negative for dysphoric mood.  The patient is not nervous/anxious.    Past Medical History:  Diagnosis Date  . Gout   . Hypertension    (-) CA, DVT  Family History  Problem Relation Age of Onset  . Hypertension Mother   . HIV Brother   . Other Father     unknown- left family when patient age 32  . Colon cancer Neg Hx   . Pancreatic cancer Neg Hx   . Rectal cancer Neg Hx   . Stomach cancer Neg Hx      Past Surgical History:  Procedure Laterality Date  . ROTATOR CUFF REPAIR     left years ago    Social History   Social History  . Marital status: Married    Spouse name: N/A  . Number of children: N/A  . Years of education: N/A   Occupational History  . Not on file.   Social History Main Topics  . Smoking status: Never Smoker  . Smokeless tobacco: Never Used  . Alcohol use No  . Drug use: No  . Sexual activity: Not on file   Other Topics Concern  . Not on file   Social History Narrative   Married 38 years in 2017. 2 kids grown. 3 grandkids in 2017.       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 Enjoys vacations and family time     No Known Allergies   Outpatient Medications Prior to Visit  Medication Sig  Dispense Refill  . amLODipine (NORVASC) 5 MG tablet Take 1 tablet (5 mg total) by mouth daily. 90 tablet 3  . aspirin 81 MG tablet Take 81 mg by mouth daily.    Marland Kitchen atenolol (TENORMIN) 25 MG tablet Take 1 tablet (25 mg total) by mouth daily. 90 tablet 3  . MULTIPLE VITAMIN PO Take 1 tablet by mouth daily.    . predniSONE (DELTASONE) 20 MG tablet 60 mg x3d, 40 mg x3d, 20 mg x2d, 10 mg x2d (Patient not taking: Reported on 02/20/2017) 18 tablet 0   No facility-administered medications prior to visit.    No orders of the defined types were placed in this encounter.        Objective:   Physical Exam   Vitals:  Vitals:   02/20/17 1537  BP: 118/62  Pulse: 64  SpO2: 96%  Weight: 220 lb 6.4 oz (100 kg)  Height: 6\' 1"  (1.854  m)    Constitutional/General:  Pleasant, well-nourished, well-developed, not in any distress,  Comfortably seating.  Well kempt  Body mass index is 29.08 kg/m. Wt Readings from Last 3 Encounters:  02/20/17 220 lb 6.4 oz (100 kg)  12/29/16 220 lb (99.8 kg)  11/13/16 218 lb 9.6 oz (99.2 kg)    Neck circumference:   HEENT: Pupils equal and reactive to light and accommodation. Anicteric sclerae. Normal nasal mucosa.   No oral  lesions,  mouth clear,  oropharynx clear, no postnasal drip. (-) Oral thrush. No dental caries.  Airway - Mallampati class III  Neck: No masses. Midline trachea. No JVD, (-) LAD. (-) bruits appreciated.  Respiratory/Chest: Grossly normal chest. (-) deformity. (-) Accessory muscle use.  Symmetric expansion. (-) Tenderness on palpation.  Resonant on percussion.  Diminished BS on both lower lung zones. (-) wheezing, crackles, rhonchi (-) egophony  Cardiovascular: Regular rate and  rhythm, heart sounds normal, no murmur or gallops, no peripheral edema  Gastrointestinal:  Normal bowel sounds. Soft, non-tender. No hepatosplenomegaly.  (-) masses.   Musculoskeletal:  Normal muscle tone. Normal gait.   Extremities: Grossly normal. (-) clubbing, cyanosis.  (-) edema  Skin: (-) rash,lesions seen.   Neurological/Psychiatric : alert, oriented to time, place, person. 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  He ends up sleeping from 6:30 pm until 11 pm.  He has been doing [employer] work for 43 yrs.  He does this schedule during weekdays. On weekends, he sleeps at the same time as the wife. Around 9 pm and wakes up at 6-7 am.   He has snoring, witnessed apneas, gasping, choking.  He has hypersomnia and unrefreshed sleep every now and then. He denies getting sleepy at work.  He denies abnormal  behavior in sleep.    Hypersomnia can affect his functionality.   ESS 7.   Plan :  We discussed about the diagnosis of Obstructive Sleep Apnea (OSA) and implications of untreated OSA. We discussed about CPAP and BiPaP as possible treatment options.    We will schedule the patient for a sleep study. Plan for a HST. Need to call pt with results.  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  He works for [employer], night shift   Patient was instructed to call the office if he/she has not heard  back from the office 1-2 weeks after the sleep study.   Patient was instructed to call the office if he/she is having issues with the PAP device.   We discussed good sleep hygiene.   Patient was advised not to engage in activities requiring concentration and/or vigilance if he/she is sleepy.  Patient was advised not to drive if he/she is sleepy.       Thank you very much for letting me participate in this patient's care. Please do not hesitate to give me a call if you have any questions or concerns regarding the treatment plan.   Patient will follow up with [place] in 6-8 weeks.      Monica Becton, MD 02/20/2017   9:18 PM [facility] and [place] Pager: [phone] Office: [phone], Fax: [phone]

## 2017-03-31 ENCOUNTER — Encounter: Payer: Self-pay | Admitting: Family Medicine

## 2017-03-31 ENCOUNTER — Ambulatory Visit (INDEPENDENT_AMBULATORY_CARE_PROVIDER_SITE_OTHER): Payer: BLUE CROSS/BLUE SHIELD | Admitting: Family Medicine

## 2017-03-31 VITALS — BP 138/76 | HR 69 | Temp 98.1°F | Ht 73.0 in | Wt 222.0 lb

## 2017-03-31 DIAGNOSIS — M25562 Pain in left knee: Secondary | ICD-10-CM | POA: Diagnosis not present

## 2017-03-31 MED ORDER — PREDNISONE 5 MG PO TABS: 5.0000 mg | ORAL_TABLET | Freq: Every day | ORAL | 0 refills | Status: DC

## 2017-03-31 NOTE — Progress Notes (Signed)
Garrett Avila is a 61 y.o. male here for an acute visit.  History of Present Illness:   Garrett Avila CMA acting as scribe for Dr. Juleen Avila  Knee Pain   The incident occurred more than 1 week ago. The incident occurred at home. The injury mechanism was a direct blow. The pain is present in the left knee. The quality of the pain is described as aching. The pain is at a severity of 4/10. The pain is mild. The pain has been intermittent since onset. Associated symptoms include a loss of motion. Associated symptoms comments: Can't bend down on knee. Marland Kitchen He reports no foreign bodies present. The symptoms are aggravated by movement. He has tried ice and NSAIDs for the symptoms. The treatment provided mild relief.   PMHx, SurgHx, SocialHx, Medications, and Allergies were reviewed in the Visit Navigator and updated as appropriate.  Current Medications:   .  amLODipine (NORVASC) 5 MG tablet, Take 1 tablet (5 mg total) by mouth daily., Disp: 90 tablet, Rfl: 3 .  aspirin 81 MG tablet, Take 81 mg by mouth daily., Disp: , Rfl:  .  atenolol (TENORMIN) 25 MG tablet, Take 1 tablet (25 mg total) by mouth daily., Disp: 90 tablet, Rfl: 3 .  MULTIPLE VITAMIN PO, Take 1 tablet by mouth daily., Disp: , Rfl:   No Known Allergies   Review of Systems:   Review of Systems  Constitutional: Negative for chills, fever and malaise/fatigue.  HENT: Negative for ear pain, sinus pain and sore throat.   Eyes: Negative for blurred vision and double vision.  Respiratory: Negative for cough, shortness of breath and wheezing.   Cardiovascular: Negative for chest pain, palpitations and leg swelling.  Gastrointestinal: Negative for abdominal pain and vomiting.  Musculoskeletal: Positive for joint pain. Negative for back pain and neck pain.  Neurological: Negative for dizziness and headaches.  Psychiatric/Behavioral: Negative for depression and hallucinations.   Vitals:   Vitals:   03/31/17 1419  BP: 138/76  Pulse:  69  Temp: 98.1 F (36.7 C)  TempSrc: Oral  SpO2: 98%  Weight: 222 lb (100.7 kg)  Height: 6\' 1"  (1.854 m)     Body mass index is 29.29 kg/m.  Physical Exam:   Physical Exam  Constitutional: He is oriented to person, place, and time. He appears well-developed and well-nourished. No distress.  HENT:  Head: Normocephalic and atraumatic.  Right Ear: External ear normal.  Left Ear: External ear normal.  Nose: Nose normal.  Mouth/Throat: Oropharynx is clear and moist.  Eyes: Conjunctivae and EOM are normal. Pupils are equal, round, and reactive to light.  Neck: Normal range of motion. Neck supple.  Cardiovascular: Normal rate, regular rhythm, normal heart sounds and intact distal pulses.   Pulmonary/Chest: Effort normal and breath sounds normal.  Abdominal: Soft. Bowel sounds are normal.  Musculoskeletal:       Left knee: He exhibits decreased range of motion and swelling. He exhibits no laceration.  Neurological: He is alert and oriented to person, place, and time.  Skin: Skin is warm and dry.  Psychiatric: He has a normal mood and affect. His behavior is normal. Judgment and thought content normal.  Nursing note and vitals reviewed.   Assessment and Plan:   Garrett Avila was seen today for knee pain.  Diagnoses and all orders for this visit:  Acute pain of left knee Comments: With recent mild trauma, triggering inflammatory reaction. No sign of infection. Prednisone has worked well in the past. Also wrapped with  ACE. Red flags reviewed. Orders: -     predniSONE (DELTASONE) 5 MG tablet; Take 1 tablet (5 mg total) by mouth daily with breakfast.    . Reviewed expectations re: course of current medical issues. . Discussed self-management of symptoms. . Outlined signs and symptoms indicating need for more acute intervention. . Patient verbalized understanding and all questions were answered. Marland Kitchen Health Maintenance issues including appropriate healthy diet, exercise, and smoking  avoidance were discussed with patient. . See orders for this visit as documented in the electronic medical record. . Patient received an After Visit Summary.  CMA served as Education administrator during this visit. History, Physical, and Plan performed by medical provider. The above documentation has been reviewed and is accurate and complete. Garrett Avila, D.O.  Garrett Deutscher, DO [place], [place] 04/01/2017  Future Appointments Date Time Provider [place]  04/27/2017 10:30 AM Garrett Spatz, NP [facility] None

## 2017-04-13 ENCOUNTER — Encounter: Payer: Self-pay | Admitting: Family Medicine

## 2017-04-13 ENCOUNTER — Ambulatory Visit (INDEPENDENT_AMBULATORY_CARE_PROVIDER_SITE_OTHER): Payer: BLUE CROSS/BLUE SHIELD | Admitting: Family Medicine

## 2017-04-13 VITALS — BP 128/76 | HR 68 | Temp 98.4°F | Ht 73.0 in | Wt 222.0 lb

## 2017-04-13 DIAGNOSIS — M25561 Pain in right knee: Secondary | ICD-10-CM

## 2017-04-13 MED ORDER — MELOXICAM 15 MG PO TABS: 15.0000 mg | ORAL_TABLET | Freq: Every day | ORAL | 0 refills | Status: DC

## 2017-04-13 MED ORDER — PREDNISONE 5 MG PO TABS: 5.0000 mg | ORAL_TABLET | Freq: Every day | ORAL | 0 refills | Status: DC

## 2017-04-13 MED ORDER — DICLOFENAC SODIUM 2 % TD SOLN: 2.0000 g | Freq: Every day | TRANSDERMAL | 0 refills | Status: DC

## 2017-04-13 NOTE — Progress Notes (Signed)
Garrett Avila is a 61 y.o. male here for an acute visit.  History of Present Illness:   Garrett Avila CMA acting as scribe for Dr. Juleen China.  Knee Pain   The incident occurred 6 to 12 hours ago. The incident occurred at home. The injury mechanism is unknown. The pain is present in the right knee. The quality of the pain is described as stabbing. The pain is at a severity of 8/10. The pain is moderate. The pain has been constant since onset. He reports no foreign bodies present. He has tried nothing for the symptoms.   PMHx, SurgHx, SocialHx, Medications, and Allergies were reviewed in the Visit Navigator and updated as appropriate.  Current Medications:   .  amLODipine (NORVASC) 5 MG tablet, Take 1 tablet (5 mg total) by mouth daily., Disp: 90 tablet, Rfl: 3 .  aspirin 81 MG tablet, Take 81 mg by mouth daily., Disp: , Rfl:  .  atenolol (TENORMIN) 25 MG tablet, Take 1 tablet (25 mg total) by mouth daily., Disp: 90 tablet, Rfl: 3 .  MULTIPLE VITAMIN PO, Take 1 tablet by mouth daily., Disp: , Rfl:    No Known Allergies   Review of Systems:   Review of Systems  Constitutional: Negative for chills, fever and malaise/fatigue.  HENT: Negative for ear pain, sinus pain and sore throat.   Eyes: Negative for blurred vision and double vision.  Respiratory: Negative for cough, shortness of breath and wheezing.   Cardiovascular: Negative for chest pain, palpitations and leg swelling.  Gastrointestinal: Negative for abdominal pain, nausea and vomiting.  Musculoskeletal: Positive for joint pain. Negative for back pain and neck pain.  Neurological: Negative for dizziness and headaches.  Psychiatric/Behavioral: Negative for depression, hallucinations and memory loss.   Vitals:   Vitals:   04/13/17 1328  BP: 128/76  Pulse: 68  Temp: 98.4 F (36.9 C)  TempSrc: Oral  SpO2: 96%  Weight: 222 lb (100.7 kg)  Height: 6\' 1"  (1.854 m)     Body mass index is 29.29 kg/m.  Physical Exam:    Physical Exam  Constitutional: He is oriented to person, place, and time. He appears well-developed and well-nourished. No distress.  HENT:  Head: Normocephalic and atraumatic.  Right Ear: External ear normal.  Left Ear: External ear normal.  Nose: Nose normal.  Mouth/Throat: Oropharynx is clear and moist.  Eyes: Conjunctivae and EOM are normal. Pupils are equal, round, and reactive to light.  Neck: Normal range of motion. Neck supple.  Cardiovascular: Normal rate and regular rhythm.   Pulmonary/Chest: Effort normal and breath sounds normal.  Abdominal: Soft. Bowel sounds are normal.  Musculoskeletal:       Right knee: He exhibits decreased range of motion and swelling. He exhibits no ecchymosis, no deformity and no erythema.  Neurological: He is alert and oriented to person, place, and time.  Skin: Skin is warm and dry.  Psychiatric: He has a normal mood and affect. His behavior is normal. Judgment and thought content normal.  Nursing note and vitals reviewed.   Assessment and Plan:   [name] was seen today for knee pain.  Diagnoses and all orders for this visit:  Acute pain of right knee Comments: Similar to previous episode. OA flare, triggered by minor trauma. No red flags. Acute treatment since he is leaving for the beach. Topical Rx sample. Discussed future maintenance and flare treatment with NSAIDs. Orders: -     Diclofenac Sodium 2 % SOLN; Place 2 g onto the  skin daily. -     predniSONE (DELTASONE) 5 MG tablet; Take 1 tablet (5 mg total) by mouth daily with breakfast. -     meloxicam (MOBIC) 15 MG tablet; Take 1 tablet (15 mg total) by mouth daily.   . Reviewed expectations re: course of current medical issues. . Discussed self-management of symptoms. . Outlined signs and symptoms indicating need for more acute intervention. . Patient verbalized understanding and all questions were answered. Marland Kitchen Health Maintenance issues including appropriate healthy diet, exercise,  and smoking avoidance were discussed with patient. . See orders for this visit as documented in the electronic medical record. . Patient received an After Visit Summary.  CMA served as Education administrator during this visit. History, Physical, and Plan performed by medical provider. The above documentation has been reviewed and is accurate and complete. Briscoe Deutscher, D.O.  Briscoe Deutscher, DO [place] [place] 04/13/2017  Future Appointments Date Time Provider [place]  04/27/2017 10:30 AM Magdalen Spatz, NP [facility] None

## 2017-04-20 ENCOUNTER — Ambulatory Visit: Payer: BLUE CROSS/BLUE SHIELD | Admitting: Pulmonary Disease

## 2017-04-27 ENCOUNTER — Ambulatory Visit: Payer: BLUE CROSS/BLUE SHIELD | Admitting: Acute Care

## 2017-04-27 DIAGNOSIS — G4733 Obstructive sleep apnea (adult) (pediatric): Secondary | ICD-10-CM | POA: Diagnosis not present

## 2017-04-29 ENCOUNTER — Encounter: Payer: Self-pay | Admitting: Pulmonary Disease

## 2017-04-29 ENCOUNTER — Telehealth: Payer: Self-pay | Admitting: Pulmonary Disease

## 2017-04-29 ENCOUNTER — Other Ambulatory Visit: Payer: Self-pay | Admitting: *Deleted

## 2017-04-29 DIAGNOSIS — G4733 Obstructive sleep apnea (adult) (pediatric): Secondary | ICD-10-CM | POA: Diagnosis not present

## 2017-04-29 DIAGNOSIS — G4710 Hypersomnia, unspecified: Secondary | ICD-10-CM

## 2017-04-29 HISTORY — DX: Obstructive sleep apnea (adult) (pediatric): G47.33

## 2017-04-29 NOTE — Telephone Encounter (Signed)
HST 04/27/17 >> AHI 79.4, SaO2 low 82%   Will have my nurse inform pt that sleep study shows very severe sleep apnea.  Options are 1) CPAP now, 2) ROV first.  If He is agreeable to CPAP, then please send order for auto CPAP range 5 to 15 cm H2O with heated humidity and mask of choice.  Have download sent 1 month after starting CPAP and set up ROV 2 months after starting CPAP.  ROV can be with me or NP.

## 2017-04-29 NOTE — Telephone Encounter (Signed)
Spoke with pt, aware of results/recs.  cpap ordered.  Pt already has rov in appropriate time frame with SG.  Nothing further needed at this time.

## 2017-04-29 NOTE — Telephone Encounter (Signed)
lmtcb for pt. Will route back to Ashtyn's box for follow-up.

## 2017-04-29 NOTE — Telephone Encounter (Signed)
Pt returned phone call, pt contact # [phone]...ert

## 2017-04-29 NOTE — Telephone Encounter (Signed)
ATC pt, no answer. Left message for pt to call back.  

## 2017-05-26 ENCOUNTER — Other Ambulatory Visit: Payer: Self-pay | Admitting: Family Medicine

## 2017-05-26 DIAGNOSIS — M25561 Pain in right knee: Secondary | ICD-10-CM

## 2017-06-11 ENCOUNTER — Encounter: Payer: BLUE CROSS/BLUE SHIELD | Admitting: Family Medicine

## 2017-06-22 ENCOUNTER — Encounter: Payer: Self-pay | Admitting: Acute Care

## 2017-06-22 ENCOUNTER — Ambulatory Visit (INDEPENDENT_AMBULATORY_CARE_PROVIDER_SITE_OTHER): Payer: BLUE CROSS/BLUE SHIELD | Admitting: Acute Care

## 2017-06-22 DIAGNOSIS — G4733 Obstructive sleep apnea (adult) (pediatric): Secondary | ICD-10-CM | POA: Diagnosis not present

## 2017-06-22 NOTE — Assessment & Plan Note (Addendum)
Compliance is a 100% however patient rarely sleeps for 4 hours at a time Plan Continue on CPAP at bedtime. You appear to be benefiting from the treatment Goal is to wear for at least 4-6 hours each night for maximal clinical benefit. Continue to work on weight loss, as the link between excess weight  and sleep apnea is well established.  Do not drive if sleepy. Clean mask, tubing , reservoir and filter. every week with soapy water Follow up with Dr.Sood or NP In 6 months  or before as needed. Please contact office for sooner follow up if symptoms do not improve or worsen or seek emergency care    Consider increasing to 10 cm set pressure at next visit if AHI remains greater than 3 Considering patient's original sleep study showed AHI of 80, patient is benefiting from therapy based on AHI of 4.1 per download 06/22/2017

## 2017-06-22 NOTE — Progress Notes (Signed)
History of Present Illness Garrett Avila is a 61 y.o. male with OSA on CPAP therapy. He was followed by Dr. Corrie Dandy and is now followed by Dr. Halford Chessman.   06/22/2017 CPAP Follow up: Pt.presents for CPAP follow up. He states he is doing well with his therapy. He states he wears his machine every night. He only sleeps an average of 3 hours and 40 minutes. He works third shift. He states he has no daytime sleepiness. 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 He states he is more focused when awake. He denies fever, chest pain, orthopnea or hemoptysis. He denies any issues with equipment.  Test Results: Down Load 05/23/2017-06/21/2017 Airsense AutoSet 5-15 cm H2O Usage days 30 of 30 or 100% Greater than 4 hours equals 14 days or 47% Less than 4 hours equals 16 days or 53% Average pressure is 7.6 cm H2O AHI equals 4.1     HST 04/27/17 >> AHI 79.4, SaO2 low 82%       CBC Latest Ref Rng & Units 10/31/2016 10/03/2016 04/28/2016  WBC 3.8 - 10.8 K/uL CANCELED 8.5 7.5  Hemoglobin 13.2 - 17.1 g/dL CANCELED 16.0 15.7  Hematocrit 38.5 - 50.0 % CANCELED 44.3 46.3  Platelets 140 - 400 K/uL CANCELED 148(L) 180.0    BMP Latest Ref Rng & Units 10/09/2016 10/03/2016 04/28/2016  Glucose 65 - 99 mg/dL - 145(H) 133(H)  BUN 6 - 20 mg/dL - 14 14  Creatinine 0.61 - 1.24 mg/dL - 1.20 1.35  Sodium 135 - 145 mmol/L - 136 139  Potassium 3.5 - 5.1 mEq/L 3.5 3.0(L) 4.0  Chloride 101 - 111 mmol/L - 99(L) 99  CO2 22 - 32 mmol/L - 28 31  Calcium 8.9 - 10.3 mg/dL - 9.0 9.9      Past medical hx Past Medical History:  Diagnosis Date  . Gout   . Hypertension   . OSA (obstructive sleep apnea) 04/29/2017     Social History  Substance Use Topics  . Smoking status: Never Smoker  . Smokeless tobacco: Never Used  . Alcohol use No    Mr.Koontz reports that he has never smoked. He has never used smokeless tobacco.  He reports that he does not drink alcohol or use drugs.  Tobacco Cessation: Never smoker  Past surgical hx, Family hx, Social hx all reviewed.  Current Outpatient Prescriptions on File Prior to Visit  Medication Sig  . amLODipine (NORVASC) 5 MG tablet Take 1 tablet (5 mg total) by mouth daily.  Marland Kitchen aspirin 81 MG tablet Take 81 mg by mouth daily.  Marland Kitchen atenolol (TENORMIN) 25 MG tablet Take 1 tablet (25 mg total) by mouth daily.  . MULTIPLE VITAMIN PO Take 1 tablet by mouth daily.   No current facility-administered medications on file prior to visit.      No Known Allergies  Review Of Systems:  Constitutional:   No  weight loss, night sweats,  Fevers, chills, fatigue, or  lassitude.  HEENT:   No headaches,  Difficulty swallowing,  Tooth/dental problems, or  Sore throat,                No sneezing, itching, ear ache, nasal congestion, post nasal drip,   CV:  No chest pain,  Orthopnea, PND, swelling in lower extremities, anasarca, dizziness, palpitations, syncope.   GI  No heartburn, indigestion, abdominal pain, nausea, vomiting, diarrhea, change in bowel  habits, loss of appetite, bloody stools.   Resp: No shortness of breath with exertion or at rest.  No excess mucus, no productive cough,  No non-productive cough,  No coughing up of blood.  No change in color of mucus.  No wheezing.  No chest wall deformity  Skin: no rash or lesions.  GU: no dysuria, change in color of urine, no urgency or frequency.  No flank pain, no hematuria   MS:  No joint pain or swelling.  No decreased range of motion.  No back pain.  Psych:  No change in mood or affect. No depression or anxiety.  No memory loss.   Vital Signs BP 124/82 (BP Location: Left Arm, Cuff Size: Normal)   Pulse 63   Ht 6\' 1"  (1.854 m)   Wt 227 lb 9.6 oz (103.2 kg)   SpO2 99%   BMI 30.03 kg/m    Physical Exam:  General- No distress,  A&Ox3 ENT: No sinus tenderness, TM, pleasant clear, pale nasal mucosa, no oral exudate,no  post nasal drip, no LAN Cardiac: S1, S2, regular rate and rhythm, no murmur Chest: No wheeze/ rales/ dullness; no accessory muscle use, no nasal flaring, no sternal retractions Abd.: Soft Non-tender, obese Ext: No clubbing cyanosis, edema Neuro:  normal strength, cranial nerves intact Skin: No rashes, warm and dry Psych: normal mood and behavior   Assessment/Plan  OSA (obstructive sleep apnea) Compliance is a 100% however patient rarely sleeps for 4 hours at a time Plan Continue on CPAP at bedtime. You appear to be benefiting from the treatment Goal is to wear for at least 4-6 hours each night for maximal clinical benefit. Continue to work on weight loss, as the link between excess weight  and sleep apnea is well established.  Do not drive if sleepy. Clean mask, tubing , reservoir and filter. every week with soapy water Follow up with Dr.Sood or NP In 6 months  or before as needed. Please contact office for sooner follow up if symptoms do not improve or worsen or seek emergency care    Consider increasing to 10 cm set pressure at next visit if AHI remains greater than 3 Considering patient's original sleep study showed AHI of 80, patient is benefiting from therapy based on AHI of 4.1 per download 06/22/2017      Magdalen Spatz, NP 06/22/2017  5:25 PM

## 2017-06-22 NOTE — Patient Instructions (Addendum)
It is nice to meet you today. Continue on CPAP at bedtime. You appear to be benefiting from the treatment Goal is to wear for at least 4-6 hours each night for maximal clinical benefit. Continue to work on weight loss, as the link between excess weight  and sleep apnea is well established.  Do not drive if sleepy. Clean mask, tubing , reservoir and filter. every week with soapy water Follow up with Dr.Sood or NP In 6 months  or before as needed. Please contact office for sooner follow up if symptoms do not improve or worsen or seek emergency care

## 2017-06-23 NOTE — Progress Notes (Signed)
I have reviewed and agree with assessment/plan.  Keshawna Dix, MD [facility] 06/23/2017, 7:43 AM Pager:  [phone]  

## 2017-06-24 ENCOUNTER — Other Ambulatory Visit: Payer: Self-pay | Admitting: Family Medicine

## 2017-06-24 ENCOUNTER — Encounter: Payer: Self-pay | Admitting: Family Medicine

## 2017-06-24 ENCOUNTER — Ambulatory Visit (INDEPENDENT_AMBULATORY_CARE_PROVIDER_SITE_OTHER): Payer: BLUE CROSS/BLUE SHIELD | Admitting: Family Medicine

## 2017-06-24 VITALS — BP 138/82 | HR 58 | Temp 97.9°F | Ht 73.25 in | Wt 227.4 lb

## 2017-06-24 DIAGNOSIS — Z0000 Encounter for general adult medical examination without abnormal findings: Secondary | ICD-10-CM

## 2017-06-24 DIAGNOSIS — E785 Hyperlipidemia, unspecified: Secondary | ICD-10-CM | POA: Diagnosis not present

## 2017-06-24 DIAGNOSIS — Z125 Encounter for screening for malignant neoplasm of prostate: Secondary | ICD-10-CM

## 2017-06-24 DIAGNOSIS — M1000 Idiopathic gout, unspecified site: Secondary | ICD-10-CM | POA: Diagnosis not present

## 2017-06-24 DIAGNOSIS — I10 Essential (primary) hypertension: Secondary | ICD-10-CM | POA: Diagnosis not present

## 2017-06-24 LAB — COMPREHENSIVE METABOLIC PANEL
ALK PHOS: 79 U/L (ref 39–117)
ALT: 23 U/L (ref 0–53)
AST: 21 U/L (ref 0–37)
Albumin: 4.6 g/dL (ref 3.5–5.2)
BUN: 9 mg/dL (ref 6–23)
CHLORIDE: 100 meq/L (ref 96–112)
CO2: 33 meq/L — AB (ref 19–32)
Calcium: 9.9 mg/dL (ref 8.4–10.5)
Creatinine, Ser: 1.09 mg/dL (ref 0.40–1.50)
GFR: 73.15 mL/min (ref >60.00)
GLUCOSE: 93 mg/dL (ref 70–99)
POTASSIUM: 4.5 meq/L (ref 3.5–5.1)
SODIUM: 139 meq/L (ref 135–145)
TOTAL PROTEIN: 7.7 g/dL (ref 6.0–8.3)
Total Bilirubin: 1.5 mg/dL — ABNORMAL HIGH (ref 0.2–1.2)

## 2017-06-24 LAB — URIC ACID: URIC ACID, SERUM: 8.6 mg/dL — AB (ref 4.0–7.8)

## 2017-06-24 LAB — LDL CHOLESTEROL, DIRECT: Direct LDL: 108 mg/dL

## 2017-06-24 LAB — LIPID PANEL
CHOL/HDL RATIO: 5
Cholesterol: 189 mg/dL (ref 0–200)
HDL: 39 mg/dL — AB (ref >39.00)
NONHDL: 149.77
Triglycerides: 289 mg/dL — ABNORMAL HIGH (ref 0.0–149.0)
VLDL: 57.8 mg/dL — AB (ref 0.0–40.0)

## 2017-06-24 LAB — CBC
HCT: 44 % (ref 39.0–52.0)
Hemoglobin: 14.8 g/dL (ref 13.0–17.0)
MCHC: 33.8 g/dL (ref 30.0–36.0)
MCV: 93.9 fl (ref 78.0–100.0)
Platelets: 197 10*3/uL (ref 150.0–400.0)
RBC: 4.68 Mil/uL (ref 4.22–5.81)
RDW: 13.2 % (ref 11.5–15.5)
WBC: 6.7 10*3/uL (ref 4.0–10.5)

## 2017-06-24 NOTE — Assessment & Plan Note (Signed)
Hyperlipidemia- had declined statin despite >10% risk last year- he wanted to work on weight loss goal closer to 200- if not there by next year agrees to strongly consider statin Lab Results  Component Value Date   CHOL 234 (H) 04/28/2016   HDL 43.40 04/28/2016   LDLDIRECT 139.0 04/28/2016   TRIG 314.0 (H) 04/28/2016   CHOLHDL 5 04/28/2016

## 2017-06-24 NOTE — Telephone Encounter (Signed)
Can we refill this? 

## 2017-06-24 NOTE — Assessment & Plan Note (Signed)
Gout- declines uric acid lowering medicine. Only flares up if trauma to knee. Check uric acid.

## 2017-06-24 NOTE — Patient Instructions (Addendum)
Let us know when you get the flu shot in the fall so we can update our records.   Please stop by lab before you go  No changes today. If weight not done by next year- likely start cholesterol medicine

## 2017-06-24 NOTE — Progress Notes (Signed)
Phone: [phone]  Subjective:  Patient presents today for their annual physical. Chief complaint-noted.   See problem oriented charting- ROS- full  review of systems was completed and negative except for: knee swelilng with trauma but has not happened recently. No chest pain or shortness of breath. No headache or blurry vision.   The following were reviewed and entered/updated in epic: Past Medical History:  Diagnosis Date  . Gout   . Hypertension   . OSA (obstructive sleep apnea) 04/29/2017   Patient Active Problem List   Diagnosis Date Noted  . Hyperlipidemia 05/05/2016    Priority: Medium  . Essential hypertension 01/24/2008    Priority: Medium  . Gout 10/25/2014    Priority: Low  . OSA (obstructive sleep apnea) 04/29/2017  . Hypersomnia 02/20/2017  . Acute pain of right knee 10/31/2016   Past Surgical History:  Procedure Laterality Date  . ROTATOR CUFF REPAIR     left years ago    Family History  Problem Relation Age of Onset  . Hypertension Mother   . HIV Brother   . Other Father        unknown- left family when patient age 53  . Colon cancer Neg Hx   . Pancreatic cancer Neg Hx   . Rectal cancer Neg Hx   . Stomach cancer Neg Hx     Medications- reviewed and updated Current Outpatient Prescriptions  Medication Sig Dispense Refill  . amLODipine (NORVASC) 5 MG tablet Take 1 tablet (5 mg total) by mouth daily. 90 tablet 3  . aspirin 81 MG tablet Take 81 mg by mouth daily.    Marland Kitchen atenolol (TENORMIN) 25 MG tablet Take 1 tablet (25 mg total) by mouth daily. 90 tablet 3  . meloxicam (MOBIC) 15 MG tablet TAKE 1 TABLET (15 MG TOTAL) BY MOUTH DAILY. 30 tablet 0  . MULTIPLE VITAMIN PO Take 1 tablet by mouth daily.     No current facility-administered medications for this visit.     Allergies-reviewed and updated No Known Allergies  Social History   Social History  . Marital status: Married    Spouse name: N/A  . Number of children: N/A  . Years of  education: N/A   Social History Main Topics  . Smoking status: Never Smoker  . Smokeless tobacco: Never Used  . Alcohol use No  . Drug use: No  . Sexual activity: Not Asked   Other Topics Concern  . None   Social History Narrative   Married 38 years in 2017. 2 kids grown. 3 grandkids in 2017.       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 Enjoys vacations and family time    Objective: BP 138/82 (BP Location: Left Arm, Patient Position: Sitting, Cuff Size: Large)   Pulse (!) 58   Temp 97.9 F (36.6 C) (Oral)   Ht 6' 1.25" (1.861 m)   Wt 227 lb 6.4 oz (103.1 kg)   SpO2 97%   BMI 29.80 kg/m  Gen: NAD, resting comfortably HEENT: Mucous membranes are moist. Oropharynx normal Neck: no thyromegaly CV: RRR no murmurs rubs or gallops Lungs: CTAB no crackles, wheeze, rhonchi Abdomen: soft/nontender/nondistended/normal bowel sounds. No rebound or guarding.  Ext: trace edema Skin: warm, dry Neuro: grossly normal, moves all extremities, PERRLA Rectal: normal tone, normal sized prostate, no masses or tenderness  Assessment/Plan:  61 y.o. male presenting for annual physical.  Health Maintenance counseling: 1.  Anticipatory guidance: Patient counseled regarding regular dental exams -q6 months, eye exams -yearly, wearing seatbelts.  2. Risk factor reduction:  Advised patient of need for regular exercise and diet rich and fruits and vegetables to reduce risk of heart attack and stroke. 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 Diet-  States got weight down to 216 and has gone back up after 2 vacations and didn't eat well- encouraged balanced diet. Wants to be under 200.  Wt Readings from Last 3 Encounters:  06/24/17 227 lb 6.4 oz (103.1 kg)  06/22/17 227 lb 9.6 oz (103.2 kg)  04/13/17 222 lb (100.7 kg)  3. Immunizations/screenings/ancillary studies- flu shot in the fall . Discussed shingrix - likely  next year due to shortage. Declines HIV and HCV screen Immunization History  Administered Date(s) Administered  . Influenza Whole 09/21/2010, 09/06/2011  . Influenza-Unspecified 08/12/2016  . Td 01/24/2008  4. Prostate cancer screening-  update psa trend. Low risk rectal.  Lab Results  Component Value Date   PSA 1.20 04/28/2016   PSA 1.00 09/07/2014   PSA 0.86 12/06/2012   5. Colon cancer screening - 03/03/14 with 5 year follow up 6. Skin cancer screening- sees dermatology yearly. No skin cancers. Advised regular sunscreen- does this with business and wears large hat.   Status of chronic or acute concerns   Hyperlipidemia Hyperlipidemia- had declined statin despite >10% risk last year- he wanted to work on weight loss goal closer to 200- if not there by next year agrees to strongly consider statin Lab Results  Component Value Date   CHOL 234 (H) 04/28/2016   HDL 43.40 04/28/2016   LDLDIRECT 139.0 04/28/2016   TRIG 314.0 (H) 04/28/2016   CHOLHDL 5 04/28/2016     Essential hypertension HTN- controlled on amlodipine 5mg  and atenolol 25mg . Systolic high normal- would prefer lower but has been in the past. Home checks 128/72 BP Readings from Last 3 Encounters:  06/24/17 138/82  06/22/17 124/82  04/13/17 128/76     Gout Gout- declines uric acid lowering medicine. Only flares up if trauma to knee. Check uric acid.    Return in about 1 year (around 06/24/2018) for physical.  Orders Placed This Encounter  Procedures  . CBC    [place]  . Comprehensive metabolic panel    [place]    Order Specific Question:   Has the patient fasted?    Answer:   No  . Lipid panel    [place]    Order Specific Question:   Has the patient fasted?    Answer:   No  . PSA  . Uric Acid   Return precautions advised.  [name], MD

## 2017-06-24 NOTE — Assessment & Plan Note (Signed)
HTN- controlled on amlodipine 5mg  and atenolol 25mg . Systolic high normal- would prefer lower but has been in the past. Home checks 128/72 BP Readings from Last 3 Encounters:  06/24/17 138/82  06/22/17 124/82  04/13/17 128/76

## 2017-06-25 LAB — PSA: PSA: 1.38 ng/mL (ref 0.10–4.00)

## 2017-10-28 ENCOUNTER — Other Ambulatory Visit: Payer: Self-pay

## 2017-10-28 MED ORDER — ATENOLOL 25 MG PO TABS: 25.0000 mg | ORAL_TABLET | Freq: Every day | ORAL | 3 refills | Status: DC

## 2017-10-28 MED ORDER — AMLODIPINE BESYLATE 5 MG PO TABS: 5.0000 mg | ORAL_TABLET | Freq: Every day | ORAL | 3 refills | Status: DC

## 2017-11-10 ENCOUNTER — Encounter: Payer: Self-pay | Admitting: Family Medicine

## 2017-11-10 DIAGNOSIS — Z85828 Personal history of other malignant neoplasm of skin: Secondary | ICD-10-CM | POA: Insufficient documentation

## 2017-12-28 ENCOUNTER — Encounter: Payer: Self-pay | Admitting: Adult Health

## 2017-12-28 ENCOUNTER — Ambulatory Visit: Payer: BLUE CROSS/BLUE SHIELD | Admitting: Adult Health

## 2017-12-28 DIAGNOSIS — G4733 Obstructive sleep apnea (adult) (pediatric): Secondary | ICD-10-CM

## 2017-12-28 NOTE — Progress Notes (Signed)
@Patient  ID: [name], male    DOB: 09/08/1956, 62 y.o.   MRN: [id number]  Chief Complaint  Patient presents with  . Follow-up    OSA    Referring provider: Marin Olp, MD  HPI: 62 year old male followed for obstructive sleep apnea  TEST  HST 04/27/17 >> AHI 79.4, SaO2 low 82%       12/28/2017 Follow up : OSA  Patient presents for a six-month follow-up for severe sleep apnea.  Patient is on CPAP at bedtime. Says he is trying to wear it most nights.  Download shows 67% compliance.  Patient using it on average 3 hours.  Patient is on CPAP 5-15 centers H2O.  AHI is 4.5. Went on [place] was gone for 10 days. Denies daytime sleepinesss.  Patient is encouraged on CPAP compliance.  Patient education given on potential complications of uncontrolled OSA. Working on weight loss, has lost 20lbs.  He works night shift and does not sleep more than 4 hrs each night . Has 2 jobs      No Known Allergies  Immunization History  Administered Date(s) Administered  . Influenza Split 08/03/2017  . Influenza Whole 09/21/2010, 09/06/2011  . Influenza-Unspecified 08/12/2016  . Td 01/24/2008    Past Medical History:  Diagnosis Date  . Gout   . Hypertension   . OSA (obstructive sleep apnea) 04/29/2017    Tobacco History: Social History   Tobacco Use  Smoking Status Never Smoker  Smokeless Tobacco Never Used   Counseling given: Not Answered   Outpatient Encounter Medications as of 12/28/2017  Medication Sig  . amLODipine (NORVASC) 5 MG tablet Take 1 tablet (5 mg total) by mouth daily.  Marland Kitchen aspirin 81 MG tablet Take 81 mg by mouth daily.  Marland Kitchen atenolol (TENORMIN) 25 MG tablet Take 1 tablet (25 mg total) by mouth daily.  . meloxicam (MOBIC) 15 MG tablet TAKE 1 TABLET (15 MG TOTAL) BY MOUTH DAILY.  . MULTIPLE VITAMIN PO Take 1 tablet by mouth daily.   No facility-administered encounter medications on file as of 12/28/2017.      Review of Systems  Constitutional:   No   weight loss, night sweats,  Fevers, chills, fatigue, or  lassitude.  HEENT:   No headaches,  Difficulty swallowing,  Tooth/dental problems, or  Sore throat,                No sneezing, itching, ear ache, nasal congestion, post nasal drip,   CV:  No chest pain,  Orthopnea, PND, swelling in lower extremities, anasarca, dizziness, palpitations, syncope.   GI  No heartburn, indigestion, abdominal pain, nausea, vomiting, diarrhea, change in bowel habits, loss of appetite, bloody stools.   Resp: No shortness of breath with exertion or at rest.  No excess mucus, no productive cough,  No non-productive cough,  No coughing up of blood.  No change in color of mucus.  No wheezing.  No chest wall deformity  Skin: no rash or lesions.  GU: no dysuria, change in color of urine, no urgency or frequency.  No flank pain, no hematuria   MS:  No joint pain or swelling.  No decreased range of motion.  No back pain.    Physical Exam  BP 124/68 (BP Location: Left Arm, Cuff Size: Normal)   Pulse 65   Ht 6' 1.5" (1.867 m)   Wt 219 lb (99.3 kg)   SpO2 97%   BMI 28.50 kg/m   GEN: A/Ox3; pleasant , NAD, well  nourished    HEENT:  [place]/AT,  EACs-clear, TMs-wnl, NOSE-clear, THROAT-clear, no lesions, no postnasal drip or exudate noted. Class 2 MP airway   NECK:  Supple w/ fair ROM; no JVD; normal carotid impulses w/o bruits; no thyromegaly or nodules palpated; no lymphadenopathy.    RESP  Clear  P & A; w/o, wheezes/ rales/ or rhonchi. no accessory muscle use, no dullness to percussion  CARD:  RRR, no m/r/g, no peripheral edema, pulses intact, no cyanosis or clubbing.  GI:   Soft & nt; nml bowel sounds; no organomegaly or masses detected.   Musco: Warm bil, no deformities or joint swelling noted.   Neuro: alert, no focal deficits noted.    Skin: Warm, no lesions or rashes    Lab Results:  CBC    Component Value Date/Time   WBC 6.7 06/24/2017 1202   RBC 4.68 06/24/2017 1202   HGB 14.8 06/24/2017  1202   HCT 44.0 06/24/2017 1202   PLT 197.0 06/24/2017 1202   MCV 93.9 06/24/2017 1202   MCH CANCELED 10/31/2016 1601   MCHC 33.8 06/24/2017 1202   RDW 13.2 06/24/2017 1202   LYMPHSABS CANCELED 10/31/2016 1601   MONOABS CANCELED 10/31/2016 1601   EOSABS CANCELED 10/31/2016 1601   BASOSABS CANCELED 10/31/2016 1601    BMET    Component Value Date/Time   NA 139 06/24/2017 1202   K 4.5 06/24/2017 1202   CL 100 06/24/2017 1202   CO2 33 (H) 06/24/2017 1202   GLUCOSE 93 06/24/2017 1202   BUN 9 06/24/2017 1202   CREATININE 1.09 06/24/2017 1202   CALCIUM 9.9 06/24/2017 1202   GFRNONAA >60 10/03/2016 0030   GFRAA >60 10/03/2016 0030    BNP No results found for: BNP  ProBNP No results found for: PROBNP  Imaging: No results found.   Assessment & Plan:   OSA (obstructive sleep apnea) Cont on CPAP At bedtime   Advised on CPAP compliance and usage   Plan  Patient Instructions  Continue on CPAP at bedtime Try to wear each night for at least 4-6 hours Work on healthy weight Do not drive sleeping Follow-up in 6 months with Dr. Halford Chessman  And As needed          Rexene Edison, NP 12/28/2017

## 2017-12-28 NOTE — Assessment & Plan Note (Signed)
Cont on CPAP At bedtime   Advised on CPAP compliance and usage   Plan  Patient Instructions  Continue on CPAP at bedtime Try to wear each night for at least 4-6 hours Work on healthy weight Do not drive sleeping Follow-up in 6 months with Dr. Halford Chessman  And As needed

## 2017-12-28 NOTE — Patient Instructions (Signed)
Continue on CPAP at bedtime Try to wear each night for at least 4-6 hours Work on healthy weight Do not drive sleeping Follow-up in 6 months with Dr. Halford Chessman  And As needed

## 2017-12-28 NOTE — Progress Notes (Signed)
Reviewed and agree with assessment/plan.   Brystal Kildow, MD [facility] 10/29/2016, 12:24 PM Pager:  [phone]  

## 2018-08-31 ENCOUNTER — Other Ambulatory Visit: Payer: Self-pay

## 2018-08-31 ENCOUNTER — Encounter: Payer: Self-pay | Admitting: Family Medicine

## 2018-08-31 ENCOUNTER — Ambulatory Visit (INDEPENDENT_AMBULATORY_CARE_PROVIDER_SITE_OTHER): Payer: BLUE CROSS/BLUE SHIELD | Admitting: Family Medicine

## 2018-08-31 ENCOUNTER — Ambulatory Visit (INDEPENDENT_AMBULATORY_CARE_PROVIDER_SITE_OTHER): Payer: BLUE CROSS/BLUE SHIELD

## 2018-08-31 VITALS — BP 120/64 | HR 66 | Temp 100.5°F | Ht 73.5 in | Wt 219.0 lb

## 2018-08-31 DIAGNOSIS — J181 Lobar pneumonia, unspecified organism: Secondary | ICD-10-CM

## 2018-08-31 DIAGNOSIS — R509 Fever, unspecified: Secondary | ICD-10-CM

## 2018-08-31 DIAGNOSIS — J189 Pneumonia, unspecified organism: Secondary | ICD-10-CM

## 2018-08-31 LAB — POC INFLUENZA A&B (BINAX/QUICKVUE)
INFLUENZA A, POC: NEGATIVE
INFLUENZA B, POC: NEGATIVE

## 2018-08-31 IMAGING — DX DG CHEST 2V
2 series · 2 of 2 positions shown · non-contrast
Comparison: None.

CLINICAL DATA: Cough, congestion and fever.

EXAM:
CHEST - 2 VIEW

[chest pa]
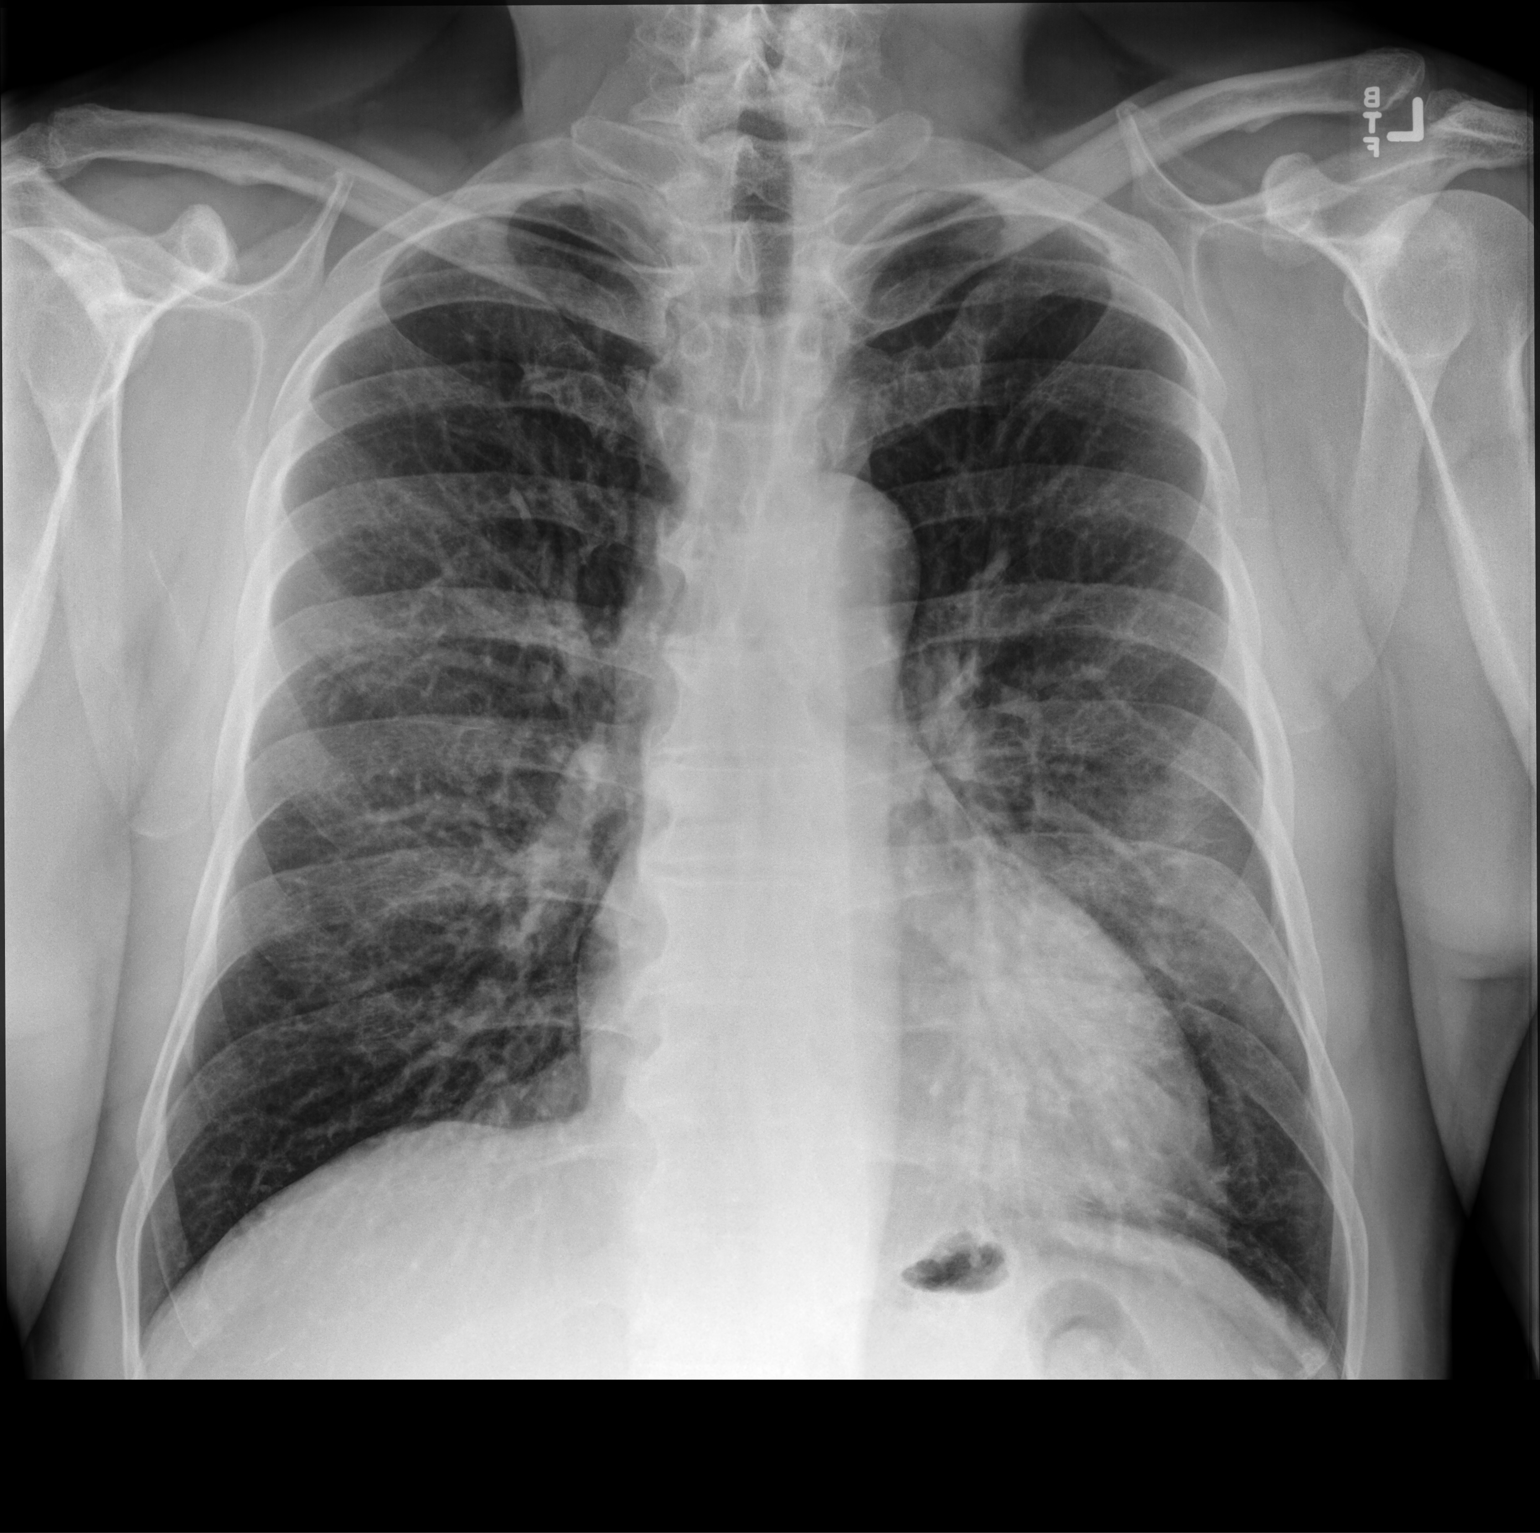

[chest lat]
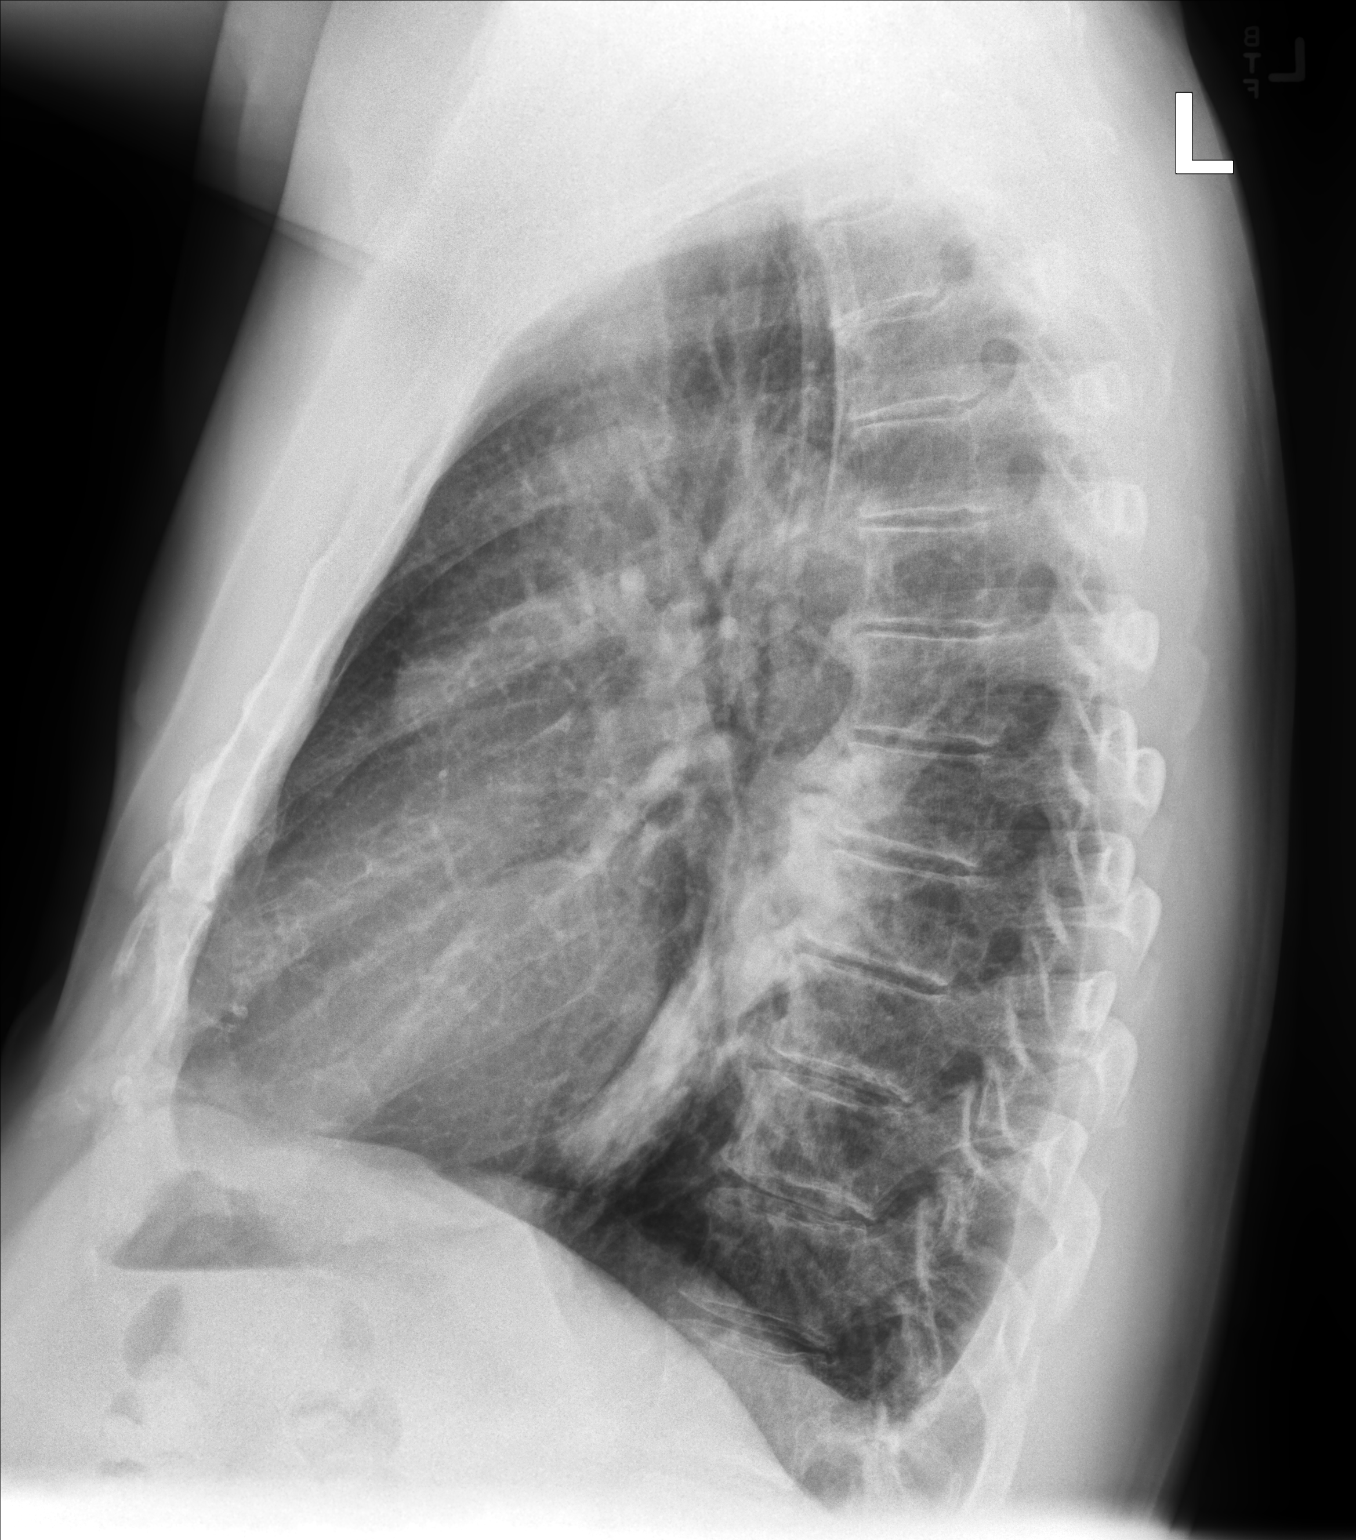

[2 of 2 positions shown; findings below may reference images not displayed]

FINDINGS: Trachea is midline. Heart is at the upper limits of normal in size
to mildly enlarged. There is airspace consolidation in the left
lower lobe. Lungs are otherwise clear. No pleural fluid.
IMPRESSION: Airspace consolidation in the left lower lobe is indicative of
pneumonia. Followup PA and lateral chest X-ray is recommended in 3-4
weeks following trial of antibiotic therapy to ensure resolution and
exclude underlying malignancy.

## 2018-08-31 MED ORDER — AZITHROMYCIN 250 MG PO TABS: ORAL_TABLET | ORAL | 0 refills | Status: DC

## 2018-08-31 NOTE — Progress Notes (Signed)
Subjective:  Garrett Avila is a 62 y.o. year old very pleasant male patient who presents for/with See problem oriented charting ROS-has had fever, cough, congestion.  Has had headaches and fatigue.  Past Medical History-  Patient Active Problem List   Diagnosis Date Noted  . Hyperlipidemia 05/05/2016    Priority: Medium  . Essential hypertension 01/24/2008    Priority: Medium  . Gout 10/25/2014    Priority: Low  . History of basal cell carcinoma of skin 11/10/2017  . OSA (obstructive sleep apnea) 04/29/2017  . Hypersomnia 02/20/2017  . Acute pain of right knee 10/31/2016    Medications- reviewed and updated Current Outpatient Medications  Medication Sig Dispense Refill  . amLODipine (NORVASC) 5 MG tablet Take 1 tablet (5 mg total) by mouth daily. 90 tablet 3  . aspirin 81 MG tablet Take 81 mg by mouth daily.    Marland Kitchen atenolol (TENORMIN) 25 MG tablet Take 1 tablet (25 mg total) by mouth daily. 90 tablet 3  . ibuprofen (ADVIL,MOTRIN) 200 MG tablet Take 200 mg by mouth every 6 (six) hours as needed.    . meloxicam (MOBIC) 15 MG tablet TAKE 1 TABLET (15 MG TOTAL) BY MOUTH DAILY. 30 tablet 0  . MULTIPLE VITAMIN PO Take 1 tablet by mouth daily.    Marland Kitchen azithromycin (ZITHROMAX) 250 MG tablet Take 2 tabs on day 1, then 1 tab daily until finished 6 tablet 0   No current facility-administered medications for this visit.     Objective: BP 120/64 (BP Location: Left Arm, Patient Position: Sitting, Cuff Size: Large)   Pulse 66   Temp (!) 100.5 F (38.1 C) (Oral) Comment: Took Ibuprofen 400 mg at 12:00 pm today  Ht 6' 1.5" (1.867 m)   Wt 219 lb (99.3 kg)   SpO2 95%   BMI 28.50 kg/m  Gen: NAD, resting comfortably, cough through visit Tympanic membranes normal, oropharynx normal other than mild erythema in pharynx CV: RRR no murmurs rubs or gallops Lungs: Patient with crackles at left lung base but otherwise CTAB no crackles, wheeze, rhonchi Abdomen: soft/nontender/nondistended/normal bowel  sounds. No rebound or guarding.  Ext: no edema Skin: warm, dry  Dg Chest 2 View  Result Date: 08/31/2018 CLINICAL DATA:  Cough, congestion and fever. EXAM: CHEST - 2 VIEW COMPARISON:  None. FINDINGS: Trachea is midline. Heart is at the upper limits of normal in size to mildly enlarged. There is airspace consolidation in the left lower lobe. Lungs are otherwise clear. No pleural fluid. IMPRESSION: Airspace consolidation in the left lower lobe is indicative of pneumonia. Followup PA and lateral chest X-ray is recommended in 3-4 weeks following trial of antibiotic therapy to ensure resolution and exclude underlying malignancy. Electronically Signed   By: Lorin Picket M.D.   On: 08/31/2018 13:16   Results for orders placed or performed in visit on 08/31/18 (from the past 24 hour(s))  POC Influenza A&B (Binax test)     Status: Normal   Collection Time: 08/31/18  4:47 PM  Result Value Ref Range   Influenza A, POC Negative Negative   Influenza B, POC Negative Negative   Assessment/Plan:  Community acquired pneumonia of left lower lobe of lung ([place]) - Plan: DG Chest 2 View S: starting last Thursday- headaches, body aches, fever up to 103.  Has dealt with chills.  Denies shortness of breath. Coughing a lot since Sunday- fevers had been going on-has noted some brown as well as yellow sputum. Total of 5 days. Denies sinus pressure.  Grandson sick 3 weeks ago with something similar to this.  Antipyretics are somewhat helpful with pain and fever. A/P: 62 year old male who is reasonably healthy outside of hypertension-presents with headache, body aches, fever, cough concerning for possible influenza- flu test negative.  We did the test as likely would have changed antibiotic course if positive.  X-ray shows left lower lobe pneumonia and this is noted with crackles on exam.  We will treat him with azithromycin.  I gave him strict follow-up if not improving by Friday or if symptoms worsen to update us-would  add Augmentin as well.  We need to get a repeat chest x-ray in a month-he wants to do this at his physical in about 5 weeks Future Appointments  Date Time Provider [place]  10/08/2018  1:15 PM Marin Olp, MD [facility]   Lab/Order associations: Community acquired pneumonia of left lower lobe of lung ([place]) - Plan: DG Chest 2 View  Fever, unspecified fever cause - Plan: POC Influenza A&B (Binax test)  Meds ordered this encounter  Medications  . azithromycin (ZITHROMAX) 250 MG tablet    Sig: Take 2 tabs on day 1, then 1 tab daily until finished    Dispense:  6 tablet    Refill:  0    Return precautions advised.  [name], MD

## 2018-08-31 NOTE — Patient Instructions (Addendum)
Flu test negative  Pneumonia based on x-ray  Take azithromycin for 5 days. I want to know if you are still having fever by fridays- if you are I would add in augmentin for additional coverage (and you would finish azithromycin)   I want to know if you have higher fevers than 103 at this point or if you get short of breath  Schedule x-ray visit in 1 month to make sure pneumonia has fully cleared on x-ray

## 2018-09-02 ENCOUNTER — Telehealth: Payer: Self-pay | Admitting: Family Medicine

## 2018-09-02 MED ORDER — AMOXICILLIN-POT CLAVULANATE 875-125 MG PO TABS: 1.0000 | ORAL_TABLET | Freq: Two times a day (BID) | ORAL | 0 refills | Status: AC

## 2018-09-02 NOTE — Telephone Encounter (Signed)
See note  Copied from [place] [id number]. Topic: General - Other >> Sep 02, 2018  9:18 AM Keene Breath wrote: Reason for CRM: Patient called to inform doctor that he is still experiencing pneumonia symptoms and not feeling much better.  Patient would like to request the other medication that the doctor recommended if he was not feeling much better.  Please advise and call back if needed.  CB# [phone]

## 2018-09-02 NOTE — Telephone Encounter (Signed)
Per OV note 08/31/2018:  Community acquired pneumonia of left lower lobe of lung ([place]) - Plan: DG Chest 2 View S: starting last Thursday- headaches, body aches, fever up to 103.  Has dealt with chills.  Denies shortness of breath. Coughing a lot since Sunday- fevers had been going on-has noted some brown as well as yellow sputum. Total of 5 days. Denies sinus pressure. Grandson sick 3 weeks ago with something similar to this.  Antipyretics are somewhat helpful with pain and fever. A/P: 62 year old male who is reasonably healthy outside of hypertension-presents with headache, body aches, fever, cough concerning for possible influenza- flu test negative.  We did the test as likely would have changed antibiotic course if positive.  X-ray shows left lower lobe pneumonia and this is noted with crackles on exam.  We will treat him with azithromycin.  I gave him strict follow-up if not improving by Friday or if symptoms worsen to update us-would add Augmentin as well.

## 2018-09-02 NOTE — Telephone Encounter (Signed)
I sent Augmentin in- please inform him

## 2018-09-02 NOTE — Addendum Note (Signed)
Addended by: Marin Olp on: 09/02/2018 08:35 PM   Modules accepted: Orders

## 2018-09-02 NOTE — Telephone Encounter (Signed)
Pt following up on call. Pt states he is still coughing a lot, and has to sit up at night (cannot lay down). Pt doesn't have the fever, but would like to take the Augmentin since he is still having symptoms.

## 2018-09-03 NOTE — Telephone Encounter (Signed)
Spoke with pt and he advised that he picked up Augmentin this morning.

## 2018-10-08 ENCOUNTER — Ambulatory Visit (INDEPENDENT_AMBULATORY_CARE_PROVIDER_SITE_OTHER): Payer: BLUE CROSS/BLUE SHIELD | Admitting: Family Medicine

## 2018-10-08 ENCOUNTER — Ambulatory Visit (INDEPENDENT_AMBULATORY_CARE_PROVIDER_SITE_OTHER): Payer: BLUE CROSS/BLUE SHIELD

## 2018-10-08 ENCOUNTER — Encounter: Payer: Self-pay | Admitting: Family Medicine

## 2018-10-08 VITALS — BP 124/76 | HR 56 | Temp 98.1°F | Ht 73.5 in | Wt 213.2 lb

## 2018-10-08 DIAGNOSIS — J181 Lobar pneumonia, unspecified organism: Secondary | ICD-10-CM

## 2018-10-08 DIAGNOSIS — J189 Pneumonia, unspecified organism: Secondary | ICD-10-CM

## 2018-10-08 DIAGNOSIS — Z0000 Encounter for general adult medical examination without abnormal findings: Secondary | ICD-10-CM

## 2018-10-08 DIAGNOSIS — Z23 Encounter for immunization: Secondary | ICD-10-CM

## 2018-10-08 DIAGNOSIS — M1000 Idiopathic gout, unspecified site: Secondary | ICD-10-CM

## 2018-10-08 DIAGNOSIS — I10 Essential (primary) hypertension: Secondary | ICD-10-CM

## 2018-10-08 DIAGNOSIS — E785 Hyperlipidemia, unspecified: Secondary | ICD-10-CM

## 2018-10-08 DIAGNOSIS — Z125 Encounter for screening for malignant neoplasm of prostate: Secondary | ICD-10-CM

## 2018-10-08 DIAGNOSIS — Z6827 Body mass index (BMI) 27.0-27.9, adult: Secondary | ICD-10-CM

## 2018-10-08 LAB — CBC
HEMATOCRIT: 43.8 % (ref 39.0–52.0)
Hemoglobin: 15.2 g/dL (ref 13.0–17.0)
MCHC: 34.8 g/dL (ref 30.0–36.0)
MCV: 93.7 fl (ref 78.0–100.0)
Platelets: 201 10*3/uL (ref 150.0–400.0)
RBC: 4.67 Mil/uL (ref 4.22–5.81)
RDW: 13.5 % (ref 11.5–15.5)
WBC: 6.3 10*3/uL (ref 4.0–10.5)

## 2018-10-08 LAB — COMPREHENSIVE METABOLIC PANEL
ALT: 37 U/L (ref 0–53)
AST: 39 U/L — ABNORMAL HIGH (ref 0–37)
Albumin: 4.8 g/dL (ref 3.5–5.2)
Alkaline Phosphatase: 74 U/L (ref 39–117)
BUN: 16 mg/dL (ref 6–23)
CO2: 29 mEq/L (ref 19–32)
Calcium: 9.8 mg/dL (ref 8.4–10.5)
Chloride: 102 mEq/L (ref 96–112)
Creatinine, Ser: 1.16 mg/dL (ref 0.40–1.50)
GFR: 67.79 mL/min (ref >60.00)
GLUCOSE: 89 mg/dL (ref 70–99)
Potassium: 4.1 mEq/L (ref 3.5–5.1)
SODIUM: 140 meq/L (ref 135–145)
Total Bilirubin: 1.2 mg/dL (ref 0.2–1.2)
Total Protein: 7.8 g/dL (ref 6.0–8.3)

## 2018-10-08 LAB — LIPID PANEL
CHOLESTEROL: 212 mg/dL — AB (ref 0–200)
HDL: 44.3 mg/dL (ref >39.00)
LDL Cholesterol: 136 mg/dL — ABNORMAL HIGH (ref 0–99)
NonHDL: 167.25
Total CHOL/HDL Ratio: 5
Triglycerides: 156 mg/dL — ABNORMAL HIGH (ref 0.0–149.0)
VLDL: 31.2 mg/dL (ref 0.0–40.0)

## 2018-10-08 LAB — PSA: PSA: 1.38 ng/mL (ref 0.10–4.00)

## 2018-10-08 MED ORDER — TETANUS-DIPHTH-ACELL PERTUSSIS 5-2.5-18.5 LF-MCG/0.5 IM SUSP
0.5000 mL | Freq: Once | INTRAMUSCULAR | Status: AC
Start: 2018-10-08 — End: 2018-10-08
  Administered 2018-10-08: 0.5 mL via INTRAMUSCULAR

## 2018-10-08 NOTE — Assessment & Plan Note (Signed)
Hypertension- controlled on amlodipine 5 mg and atenolol 25 mg. Losing weight- asked him to watch out for lightheadedness - has home BP cuff- may have to reduce dose

## 2018-10-08 NOTE — Patient Instructions (Addendum)
Health Maintenance Due  Topic Date Due  . TETANUS/TDAP-thanks for doing this today 01/23/2018   Please stop by x-ray before you go-you also need your blood work at the same time  Consider shingrix shot for shingles next year  [place] job on exercise and weight loss! Keep up the great work

## 2018-10-08 NOTE — Progress Notes (Signed)
Phone: [phone]  Subjective:  Patient presents today for their annual physical. Chief complaint-noted.   See problem oriented charting- ROS- full  review of systems was completed and negative including No chest pain or shortness of breath. No headache or blurry vision.    The following were reviewed and entered/updated in epic: Past Medical History:  Diagnosis Date  . Gout   . Hypertension   . OSA (obstructive sleep apnea) 04/29/2017   Patient Active Problem List   Diagnosis Date Noted  . Hyperlipidemia 05/05/2016    Priority: Medium  . Essential hypertension 01/24/2008    Priority: Medium  . Gout 10/25/2014    Priority: Low  . History of basal cell carcinoma of skin 11/10/2017  . OSA (obstructive sleep apnea) 04/29/2017  . Hypersomnia 02/20/2017  . Acute pain of right knee 10/31/2016   Past Surgical History:  Procedure Laterality Date  . ROTATOR CUFF REPAIR     left years ago    Family History  Problem Relation Age of Onset  . Hypertension Mother   . HIV Brother   . Other Father        unknown- left family when patient age 59  . Colon cancer Neg Hx   . Pancreatic cancer Neg Hx   . Rectal cancer Neg Hx   . Stomach cancer Neg Hx     Medications- reviewed and updated Current Outpatient Medications  Medication Sig Dispense Refill  . amLODipine (NORVASC) 5 MG tablet Take 1 tablet (5 mg total) by mouth daily. 90 tablet 3  . aspirin 81 MG tablet Take 81 mg by mouth daily.    Marland Kitchen atenolol (TENORMIN) 25 MG tablet Take 1 tablet (25 mg total) by mouth daily. 90 tablet 3  . ibuprofen (ADVIL,MOTRIN) 200 MG tablet Take 200 mg by mouth every 6 (six) hours as needed.    . MULTIPLE VITAMIN PO Take 1 tablet by mouth daily.     No current facility-administered medications for this visit.     Allergies-reviewed and updated No Known Allergies  Social History   Social History Narrative   Married 38 years in 2017. 2 kids grown. 3 grandkids in 2017.    Grandkids live close       Retired from [employer] after 43 years      Hobbies: Biomedical scientist business, pest control- not much time for fun. Enjoys vacations and family time    Objective: BP 124/76 (BP Location: Left Arm, Patient Position: Sitting, Cuff Size: Normal)   Pulse (!) 56   Temp 98.1 F (36.7 C) (Oral)   Ht 6' 1.5" (1.867 m)   Wt 213 lb 3.2 oz (96.7 kg)   BMI 27.75 kg/m  Gen: NAD, resting comfortably, athletic for age HEENT: Mucous membranes are moist. Oropharynx normal Neck: no thyromegaly CV: RRR no murmurs rubs or gallops Lungs: CTAB no crackles, wheeze, rhonchi Abdomen: soft/nontender/nondistended/normal bowel sounds. No rebound or guarding.  Ext: no edema Skin: warm, dry Neuro: grossly normal, moves all extremities, PERRLA  Assessment/Plan:  62 y.o. male presenting for annual physical.  Health Maintenance counseling: 1. Anticipatory guidance: Patient counseled regarding regular dental exams -q6 months, eye exams -yearly,  avoiding smoking and second hand smoke, limiting alcohol to 2 beverages per day - doesn't drink at all.   2. Risk factor reduction:  Advised patient of need for regular exercise and diet rich and fruits and vegetables to reduce risk of heart attack and stroke. Exercise- doing 100 days of exercise with wife- some melt down  type product- started this with retirement. Diet-congratulated patient on 6 pound weight loss since last visit- he is actually down 14 since last physical- cut out junk food and drinks- sweet tea restricts to weekend- does some juice and discussed could cut back.  Wt Readings from Last 3 Encounters:  10/08/18 213 lb 3.2 oz (96.7 kg)  08/31/18 219 lb (99.3 kg)  12/28/17 219 lb (99.3 kg)  3. Immunizations/screenings/ancillary studies-updated Tdap today.  Shingrix also discussed- plan for next year  Immunization History  Administered Date(s) Administered  . Influenza Split 08/03/2017  . Influenza Whole 09/21/2010, 09/06/2011  . Influenza,inj,Quad PF,6+ Mos  07/27/2018  . Influenza-Unspecified 08/12/2016, 07/27/2018  . Td 01/24/2008  . Tdap 10/08/2018  4. Prostate cancer screening- update PSA today-defer rectal unless concerning PSA trend Lab Results  Component Value Date   PSA 1.38 06/24/2017   PSA 1.20 04/28/2016   PSA 1.00 09/07/2014   5. Colon cancer screening -03/03/2014 with 5-year follow-up-due next year  6. Skin cancer screening-sees dermatology yearly- [facility]. Advised regular sunscreen use. Denies worrisome, changing, or new skin lesions.  7. never smoker  Status of chronic or acute concerns  Patient with pneumonia in late October-need to repeat chest x-ray today  Sleeping better now that hes retired from [employer]  Hyperlipidemia Has declined statin despite 10-year risk being over 10%-update lipids and hopefully improve with weight loss.  If risk over 12% would more strongly recommend statin- perhaps once a week statin if needed- may wait until next year as making good weight progress. His goal is under 200  Essential hypertension Hypertension- controlled on amlodipine 5 mg and atenolol 25 mg. Losing weight- asked him to watch out for lightheadedness - has home BP cuff- may have to reduce dose  Gout Gout-high uric acid but declines uric acid lowering medication-intermittent flareups if trauma to his knee or in toes- been over a year so wants to hold off on medicine. Meloxicam works if needed. Now off chlorthalidone. Id be willing to send in meloxicam if needed. Skip uric acid since he would not want medication even if elevated.   1 year CPE likely  Lab/Order associations: NOT Fasting- honey nut cheerios 7 AM   Preventative health care - Plan: DG Chest 2 View  Need for Tdap vaccination - Plan: Tdap (BOOSTRIX) injection 0.5 mL  Community acquired pneumonia of left lower lobe of lung ([place]) - Plan: DG Chest 2 View  Hyperlipidemia, unspecified hyperlipidemia type - Plan: CBC, Lipid panel, Comprehensive  metabolic panel  Idiopathic gout, unspecified chronicity, unspecified site  Screening for prostate cancer - Plan: PSA  Essential hypertension  BMI 27.0-27.9,adult  Meds ordered this encounter  Medications  . Tdap (BOOSTRIX) injection 0.5 mL    Return precautions advised.  [name], MD

## 2018-10-08 NOTE — Assessment & Plan Note (Signed)
Has declined statin despite 10-year risk being over 10%-update lipids and hopefully improve with weight loss.  If risk over 12% would more strongly recommend statin- perhaps once a week statin if needed- may wait until next year as making good weight progress. His goal is under 200

## 2018-10-08 NOTE — Assessment & Plan Note (Addendum)
Gout-high uric acid but declines uric acid lowering medication-intermittent flareups if trauma to his knee or in toes- been over a year so wants to hold off on medicine. Meloxicam works if needed. Now off chlorthalidone. Id be willing to send in meloxicam if needed. Skip uric acid since he would not want medication even if elevated.

## 2018-10-15 ENCOUNTER — Other Ambulatory Visit: Payer: Self-pay | Admitting: Family Medicine

## 2018-11-17 ENCOUNTER — Ambulatory Visit: Payer: Self-pay

## 2018-11-17 ENCOUNTER — Other Ambulatory Visit: Payer: Self-pay

## 2018-11-17 MED ORDER — MELOXICAM 15 MG PO TABS: 15.0000 mg | ORAL_TABLET | Freq: Every day | ORAL | 0 refills | Status: DC

## 2018-11-17 NOTE — Telephone Encounter (Addendum)
Pt has taken Meloxicam 15 mg in the past.   Rx for Meloxicam 15 mg pending if appropriate.   Forwarding to Dr. Yong Channel to advise.

## 2018-11-17 NOTE — Telephone Encounter (Signed)
See note

## 2018-11-17 NOTE — Telephone Encounter (Signed)
Called pt and left general message advising that rx was sent in to [facility], call back with questions/concerns.

## 2018-11-17 NOTE — Addendum Note (Signed)
Addended by: Jasper Loser on: 11/17/2018 10:59 AM   Modules accepted: Orders

## 2018-11-17 NOTE — Telephone Encounter (Signed)
Called pt and pt stated that his left great toe with gout and flare began yesterday. There is no redness, mild edema. Pt stated he has pain only when he walks and it is a 6-7 out 10. Pt stated that he only needs enough meds for 2-3 days.  Per OV dated 10/08/18, Dr. Irven Shelling that he would be willing to send in Meloxicam if needed. Meloxicam is not on active med list. Routing note to provider. [facility] [place]. Reason for Disposition . Caller requesting a NON-URGENT new prescription or refill and triager unable to refill per unit policy    Meloxicam not on active med list.  Additional Information . Negative: Caller requesting a refill, no triage required, and triager able to refill per unit policy    See triage note . Commented on: Pain in the big toe joint    See documentation  Answer Assessment - Initial Assessment Questions 1. ONSET: "When did the pain start?"      yesterday 2. LOCATION: "Where is the pain located?"      Left great toe 3. PAIN: "How bad is the pain?"    (Scale 1-10; or mild, moderate, severe)   -  MILD (1-3): doesn't interfere with normal activities    -  MODERATE (4-7): interferes with normal activities (e.g., work or school) or awakens from sleep, limping    -  SEVERE (8-10): excruciating pain, unable to do any normal activities, unable to walk     6-7 out of 10, but only when he walks.  4. WORK OR EXERCISE: "Has there been any recent work or exercise that involved this part of the body?"      Just walking 5. CAUSE: "What do you think is causing the foot pain?"     gout 6. OTHER SYMPTOMS: "Do you have any other symptoms?" (e.g., leg pain, rash, fever, numbness)     no 7. PREGNANCY: "Is there any chance you are pregnant?" "When was your last menstrual period?"     n/a  Answer Assessment - Initial Assessment Questions 1. SYMPTOMS: "Do you have any symptoms?"     Left great toe with mild swelling and pain with walking. No redness. 2. SEVERITY: If  symptoms are present, ask "Are they mild, moderate or severe?"     moderate  Protocols used: MEDICATION QUESTION CALL-A-AH, FOOT PAIN-A-AH

## 2018-12-23 ENCOUNTER — Ambulatory Visit: Payer: BLUE CROSS/BLUE SHIELD | Admitting: Family Medicine

## 2018-12-23 ENCOUNTER — Encounter: Payer: Self-pay | Admitting: Family Medicine

## 2018-12-23 VITALS — BP 130/60 | HR 60 | Temp 97.6°F | Ht 73.5 in | Wt 218.0 lb

## 2018-12-23 DIAGNOSIS — M10071 Idiopathic gout, right ankle and foot: Secondary | ICD-10-CM

## 2018-12-23 DIAGNOSIS — M1000 Idiopathic gout, unspecified site: Secondary | ICD-10-CM

## 2018-12-23 MED ORDER — MELOXICAM 15 MG PO TABS: 15.0000 mg | ORAL_TABLET | Freq: Every day | ORAL | 0 refills | Status: DC

## 2018-12-23 MED ORDER — ALLOPURINOL 100 MG PO TABS: 100.0000 mg | ORAL_TABLET | Freq: Every day | ORAL | 6 refills | Status: DC

## 2018-12-23 NOTE — Assessment & Plan Note (Signed)
flare S: Right foot pain at base of 2nd and 3rd toe for 2-3 days. Pain at its worst up to 9/10. If sitting and resting has no pain- hurts to push on the area. Has not taken any medicine outside ibuprofen - didn't help. hasnt tried ice or heat. The worse position is walking in it. Hurts to bend the second toe up. 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   Trying to lose weight- a lot time since last gout flare.  Lab Results  Component Value Date   LABURIC 8.6 (H) 06/24/2017  A/P: Patient previously had not wanted to use medication to try to prevent gout flareups- with continued potential issues including possible gout flare today he has changed his mind.  See instructions below.  This could also be fracture or capsulitis of other origin-recommended sports medicine follow-up if not improving  From AVS "  Patient Instructions  I wonder if this is a gout flare up in the joint at the base of your 2nd toe.   We discussed doing x-rays but with no fall or obvious injury- you wanted to hold off for now.   Take meloxicam 15 mg daily for next 7-10 days to get this too cool off  If pain does not resolve- lets see you back or have you see Dr. Paulla Fore of sports medicine.   If it does resolve - a few days later you can start allopurinol 100mg  to lower your uric acid level and try to prevent future gout flare ups.   Then schedule a lab visit for 6 weeks from the start of the allopurinol so we can recheck uric acid levels and see if its coming down to goal of 6 or less- your last uric acid level was mid 8s. "

## 2018-12-23 NOTE — Progress Notes (Signed)
Phone [phone]   Subjective:  Garrett Avila is a 63 y.o. year old very pleasant male patient who presents for/with See problem oriented charting ROS-patient with toe pain.  No fever chills.  No nausea vomiting.   Past Medical History-  Patient Active Problem List   Diagnosis Date Noted  . Hyperlipidemia 05/05/2016    Priority: Medium  . Essential hypertension 01/24/2008    Priority: Medium  . Gout 10/25/2014    Priority: Low  . History of basal cell carcinoma of skin 11/10/2017  . OSA (obstructive sleep apnea) 04/29/2017  . Hypersomnia 02/20/2017  . Acute pain of right knee 10/31/2016    Medications- reviewed and updated Current Outpatient Medications  Medication Sig Dispense Refill  . amLODipine (NORVASC) 5 MG tablet TAKE 1 TABLET BY MOUTH EVERY DAY 90 tablet 3  . aspirin 81 MG tablet Take 81 mg by mouth daily.    Marland Kitchen atenolol (TENORMIN) 25 MG tablet TAKE 1 TABLET BY MOUTH EVERY DAY 90 tablet 3  . ibuprofen (ADVIL,MOTRIN) 200 MG tablet Take 200 mg by mouth every 6 (six) hours as needed.    . meloxicam (MOBIC) 15 MG tablet Take 1 tablet (15 mg total) by mouth daily. 30 tablet 0  . MULTIPLE VITAMIN PO Take 1 tablet by mouth daily.    Marland Kitchen therapeutic multivitamin-minerals (THERAGRAN-M) tablet Take by mouth.    Marland Kitchen allopurinol (ZYLOPRIM) 100 MG tablet Take 1 tablet (100 mg total) by mouth daily. 30 tablet 6   No current facility-administered medications for this visit.      Objective:  BP 130/60 (BP Location: Left Arm, Patient Position: Sitting, Cuff Size: Normal)   Pulse 60   Temp 97.6 F (36.4 C) (Oral)   Ht 6' 1.5" (1.867 m)   Wt 218 lb (98.9 kg)   SpO2 97%   BMI 28.37 kg/m  Gen: NAD, resting comfortably CV: RRR  Lungs: nonlabored, normal respiratory rate Abdomen: soft/nondistended Skin: warm, dry MSK: patient  tender at 2nd MTP joint right foot. Mild warmth and swellingin area without obvious erythema. Pain with extension of toe.     Assessment and Plan    Gout flare S: Right foot pain at base of 2nd and 3rd toe for 2-3 days. Pain at its worst up to 9/10. If sitting and resting has no pain- hurts to push on the area. Has not taken any medicine outside ibuprofen - didn't help. hasnt tried ice or heat. The worse position is walking in it. Hurts to bend the second toe up. 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   Trying to lose weight- a lot time since last gout flare.  Lab Results  Component Value Date   LABURIC 8.6 (H) 06/24/2017  A/P: Patient previously had not wanted to use medication to try to prevent gout flareups- with continued potential issues including possible gout flare today he has changed his mind.  See instructions below.  This could also be fracture or capsulitis of other origin-recommended sports medicine follow-up if not improving  From AVS "  Patient Instructions  I wonder if this is a gout flare up in the joint at the base of your 2nd toe.   We discussed doing x-rays but with no fall or obvious injury- you wanted to hold off for now.   Take meloxicam 15 mg daily for next 7-10 days to get this too cool off  If pain does not resolve- lets see you back or have you see Dr. Paulla Fore of sports medicine.   If it does resolve - a few days later you can start allopurinol 100mg  to lower your uric acid level and try to prevent future gout flare ups.   Then schedule a lab visit for 6 weeks from the start of the allopurinol so we can recheck uric acid levels and see if its coming down to goal of 6 or less- your last uric acid level was mid 8s. "   Future Appointments  Date Time Provider [place]  02/04/2019  8:00 AM Marin Olp, MD [facility]  10/11/2019  1:20 PM Marin Olp, MD [facility]   Meds ordered this encounter  Medications  . meloxicam (MOBIC) 15 MG tablet    Sig: Take 1 tablet (15 mg total) by mouth daily.     Dispense:  30 tablet    Refill:  0  . allopurinol (ZYLOPRIM) 100 MG tablet    Sig: Take 1 tablet (100 mg total) by mouth daily.    Dispense:  30 tablet    Refill:  6    Return precautions advised.  [name], MD

## 2018-12-23 NOTE — Patient Instructions (Addendum)
I wonder if this is a gout flare up in the joint at the base of your 2nd toe.   We discussed doing x-rays but with no fall or obvious injury- you wanted to hold off for now.   Take meloxicam 15 mg daily for next 7-10 days to get this too cool off  If pain does not resolve- lets see you back or have you see Dr. Paulla Fore of sports medicine.   If it does resolve - a few days later you can start allopurinol 100mg  to lower your uric acid level and try to prevent future gout flare ups.   Then schedule a lab visit for 6 weeks from the start of the allopurinol so we can recheck uric acid levels and see if its coming down to goal of 6 or less- your last uric acid level was mid 8s.

## 2019-02-04 ENCOUNTER — Ambulatory Visit (INDEPENDENT_AMBULATORY_CARE_PROVIDER_SITE_OTHER): Payer: BLUE CROSS/BLUE SHIELD | Admitting: Family Medicine

## 2019-02-04 ENCOUNTER — Encounter: Payer: Self-pay | Admitting: Family Medicine

## 2019-02-04 VITALS — BP 129/77 | HR 68 | Temp 99.1°F | Ht 73.0 in | Wt 213.4 lb

## 2019-02-04 DIAGNOSIS — M1000 Idiopathic gout, unspecified site: Secondary | ICD-10-CM

## 2019-02-04 DIAGNOSIS — I10 Essential (primary) hypertension: Secondary | ICD-10-CM

## 2019-02-04 NOTE — Progress Notes (Signed)
  Phone [phone]   Subjective:  Virtual visit via Video note  Our team/I connected with Garrett Avila on 02/04/19 at  8:00 AM EDT by a video enabled telemedicine application (webex) and verified that I am speaking with the correct person using two identifiers.  Location patient: [place] Location provider: [facility], office Persons participating in the virtual visit: patient  Our team/I discussed the limitations of evaluation and management by telemedicine and the availability of in person appointments. In light of current covid-19 pandemic, patient also understands that we are trying to protect them by minimizing in office contact if at all possible.  The patient expressed consent for telemedicine visit and agreed to proceed.   ROS- No chest pain or shortness of breath. No headache or blurry vision. No fever or cough.    Past Medical History-  Patient Active Problem List   Diagnosis Date Noted  . Hyperlipidemia 05/05/2016    Priority: Medium  . Essential hypertension 01/24/2008    Priority: Medium  . Gout 10/25/2014    Priority: Low  . History of basal cell carcinoma of skin 11/10/2017  . OSA (obstructive sleep apnea) 04/29/2017  . Hypersomnia 02/20/2017  . Acute pain of right knee 10/31/2016    Medications- reviewed and updated Current Outpatient Medications  Medication Sig Dispense Refill  . allopurinol (ZYLOPRIM) 100 MG tablet Take 1 tablet (100 mg total) by mouth daily. 30 tablet 6  . amLODipine (NORVASC) 5 MG tablet TAKE 1 TABLET BY MOUTH EVERY DAY 90 tablet 3  . aspirin 81 MG tablet Take 81 mg by mouth daily.    Marland Kitchen atenolol (TENORMIN) 25 MG tablet TAKE 1 TABLET BY MOUTH EVERY DAY 90 tablet 3  . MULTIPLE VITAMIN PO Take 1 tablet by mouth daily.    Marland Kitchen therapeutic multivitamin-minerals (THERAGRAN-M) tablet Take by mouth.     No current facility-administered medications for this visit.      Objective:  BP 129/77   Pulse 68   Temp 99.1 F (37.3 C) (Oral)   Ht 6'  1" (1.854 m)   Wt 213 lb 6.4 oz (96.8 kg)   BMI 28.15 kg/m  Gen: NAD, resting comfortably Lungs: nonlabored, normal respiratory rate  Skin: warm, dry, no obvious rash Normal speech     Assessment and Plan   # GOUT S:Pain n 2nd and 3rd toes calmed down for about a week and half. No redness in the foot anymore. Able get out and mow without recurrence.   Talking allopurinol without issues.    Has meloxicam on hand if needed A/P: Stable. Continue current medications.  We had planned to check uric acid today but since he is doing well and in light of covid-19 pandemic- told him we should defer this until next visit.    #hypertension S: controlled on amlodipine 5 mg, atenolol 25 mg- checked 6 pm yesterday and looked great- see below. Slightly high in the mornings before medicine gets more activ- in low 140s then trends down and down all day BP Readings from Last 3 Encounters:  02/04/19 129/77  12/23/18 130/60  10/08/18 124/76  A/P: Stable. Continue current medications.   Future Appointments  Date Time Provider [place]  10/11/2019  1:20 PM Marin Olp, MD [facility]   Return precautions advised.  [name], MD

## 2019-02-04 NOTE — Patient Instructions (Signed)
Video visit

## 2019-03-31 ENCOUNTER — Encounter: Payer: Self-pay | Admitting: Gastroenterology

## 2019-04-18 ENCOUNTER — Other Ambulatory Visit: Payer: Self-pay | Admitting: Family Medicine

## 2019-04-25 ENCOUNTER — Telehealth: Payer: Self-pay | Admitting: Family Medicine

## 2019-04-25 NOTE — Telephone Encounter (Signed)
MEDICATION: allopurinol 100 mg  PHARMACY:  [facility]  Fax [phone]  IS THIS A 90 DAY SUPPLY : yes  IS PATIENT OUT OF MEDICATION: no, not enough refills to fill 90 days  IF NOT; HOW MUCH IS LEFT: 30 days  LAST APPOINTMENT DATE: @040320  NEXT APPOINTMENT DATE:@12 /06/2019  OTHER COMMENTS:    **Let patient know to contact pharmacy at the end of the day to make sure medication is ready. **  ** Please notify patient to allow 48-72 hours to process**  **Encourage patient to contact the pharmacy for refills or they can request refills through [facility]**

## 2019-04-26 ENCOUNTER — Other Ambulatory Visit: Payer: Self-pay

## 2019-04-26 MED ORDER — ALLOPURINOL 100 MG PO TABS: 100.0000 mg | ORAL_TABLET | Freq: Every day | ORAL | 6 refills | Status: DC

## 2019-04-26 NOTE — Telephone Encounter (Signed)
Rx refill for Allopurinol sent to [facility] in [place]

## 2019-05-12 ENCOUNTER — Ambulatory Visit (AMBULATORY_SURGERY_CENTER): Payer: Self-pay | Admitting: *Deleted

## 2019-05-12 ENCOUNTER — Other Ambulatory Visit: Payer: Self-pay

## 2019-05-12 VITALS — Ht 73.0 in | Wt 215.0 lb

## 2019-05-12 DIAGNOSIS — Z86010 Personal history of colonic polyps: Secondary | ICD-10-CM

## 2019-05-12 MED ORDER — NA SULFATE-K SULFATE-MG SULF 17.5-3.13-1.6 GM/177ML PO SOLN: ORAL | 0 refills | Status: DC

## 2019-05-12 NOTE — Progress Notes (Signed)
Patient's pre-visit was done today over the phone with the patient due to COVID-19 pandemic. Name,DOB and address verified. Insurance verified. Packet of Prep instructions mailed to patient including copy of a consent form and pre-procedure patient acknowledgement form-pt is aware. Suprep $15 Coupon included. Patient understands to call us back with any questions or concerns. Patient denies any allergies to eggs or soy. Patient denies any problems with anesthesia/sedation. Patient denies any oxygen use at home. Patient denies taking any diet/weight loss medications or blood thinners. EMMI education declined by the patient. Pt is aware that care partner will wait in the car during proceudre; if they feel like they will be too hot to wait in the car; they may wait in the lobby.  We want them to wear a mask (we do not have any that we can provide them), practice social distancing, and we will check their temperatures when they get here.  I did remind patient that their care partner needs to stay in the parking lot the entire time. Pt will wear mask into building.

## 2019-05-26 ENCOUNTER — Encounter: Payer: BLUE CROSS/BLUE SHIELD | Admitting: Gastroenterology

## 2019-05-30 ENCOUNTER — Telehealth: Payer: Self-pay | Admitting: Gastroenterology

## 2019-05-30 NOTE — Telephone Encounter (Signed)
Covid-19 Screening Questions     Do you now or have you had a fever in the last 14 days?        Do you have any respiratory symptoms of shortness of breath or cough now or in the last 14 days?       Do you have any family members or close contacts with diagnosed or suspected Covid-19 in the past 14 days?        Have you been tested for Covid-19 and found to be positive?       [place] make patient aware  that care partner may wait in the car or come up to the lobby during the procedure but will need to provide their own mask.

## 2019-05-31 ENCOUNTER — Other Ambulatory Visit: Payer: Self-pay

## 2019-05-31 ENCOUNTER — Ambulatory Visit (AMBULATORY_SURGERY_CENTER): Payer: BC Managed Care – PPO | Admitting: Gastroenterology

## 2019-05-31 ENCOUNTER — Encounter: Payer: Self-pay | Admitting: Gastroenterology

## 2019-05-31 VITALS — BP 100/68 | HR 60 | Temp 98.6°F | Resp 15 | Ht 73.0 in | Wt 215.0 lb

## 2019-05-31 DIAGNOSIS — Z860100 Personal history of colon polyps, unspecified: Secondary | ICD-10-CM

## 2019-05-31 DIAGNOSIS — K635 Polyp of colon: Secondary | ICD-10-CM

## 2019-05-31 DIAGNOSIS — D125 Benign neoplasm of sigmoid colon: Secondary | ICD-10-CM

## 2019-05-31 MED ORDER — SODIUM CHLORIDE 0.9 % IV SOLN: 500.0000 mL | Freq: Once | INTRAVENOUS | Status: DC

## 2019-05-31 NOTE — Progress Notes (Signed)
PT taken to PACU. Monitors in place. VSS. Report given to RN. 

## 2019-05-31 NOTE — Progress Notes (Signed)
Pt's states no medical or surgical changes since previsit or office visit. 

## 2019-05-31 NOTE — Op Note (Signed)
[place] Patient Name: Garrett Avila Procedure Date: 05/31/2019 7:54 AM MRN: [id number] Endoscopist: Ladene Artist , MD Age: 63 Referring MD:  Date of Birth: Oct 22, 1956 Gender: Male Account #: [id number] Procedure:                Colonoscopy Indications:              Surveillance: Personal history of adenomatous                            polyps on last colonoscopy 5 years ago Medicines:                Monitored Anesthesia Care Procedure:                Pre-Anesthesia Assessment:                           - Prior to the procedure, a History and Physical                            was performed, and patient medications and                            allergies were reviewed. The patient's tolerance of                            previous anesthesia was also reviewed. The risks                            and benefits of the procedure and the sedation                            options and risks were discussed with the patient.                            All questions were answered, and informed consent                            was obtained. Prior Anticoagulants: The patient has                            taken no previous anticoagulant or antiplatelet                            agents. ASA Grade Assessment: II - A patient with                            mild systemic disease. After reviewing the risks                            and benefits, the patient was deemed in                            satisfactory condition to undergo the procedure.  After obtaining informed consent, the colonoscope                            was passed under direct vision. Throughout the                            procedure, the patient's blood pressure, pulse, and                            oxygen saturations were monitored continuously. The                            Colonoscope was introduced through the anus and                            advanced to the the cecum,  identified by                            appendiceal orifice and ileocecal valve. The                            ileocecal valve, appendiceal orifice, and rectum                            were photographed. The quality of the bowel                            preparation was good. The colonoscopy was performed                            without difficulty. The patient tolerated the                            procedure well. Scope In: 8:38:04 AM Scope Out: 8:50:26 AM Scope Withdrawal Time: 0 hours 10 minutes 28 seconds  Total Procedure Duration: 0 hours 12 minutes 22 seconds  Findings:                 The perianal and digital rectal examinations were                            normal.                           A 6 mm polyp was found in the sigmoid colon. The                            polyp was sessile. The polyp was removed with a                            cold snare. Resection and retrieval were complete.                           Anal papilla(e) were hypertrophied.  Internal hemorrhoids were found during                            retroflexion. The hemorrhoids were small and Grade                            I (internal hemorrhoids that do not prolapse).                           The exam was otherwise without abnormality on                            direct and retroflexion views. Complications:            No immediate complications. Estimated blood loss:                            None. Estimated Blood Loss:     Estimated blood loss: none. Impression:               - One 6 mm polyp in the sigmoid colon, removed with                            a cold snare. Resected and retrieved.                           - Anal papilla(e) were hypertrophied.                           - Internal hemorrhoids.                           - The examination was otherwise normal on direct                            and retroflexion views. Recommendation:           - Repeat  colonoscopy in 7 year if polyp is                            adenomatous and 10 years if polyp is not                            precancerous for surveillance.                           - Patient has a contact number available for                            emergencies. The signs and symptoms of potential                            delayed complications were discussed with the                            patient. Return to normal activities tomorrow.  Written discharge instructions were provided to the                            patient.                           - Resume previous diet.                           - Continue present medications.                           - Await pathology results. Ladene Artist, MD 05/31/2019 8:55:14 AM This report has been signed electronically.

## 2019-05-31 NOTE — Patient Instructions (Signed)
YOU HAD AN ENDOSCOPIC PROCEDURE TODAY AT THE [facility]:   Refer to the procedure report that was given to you for any specific questions about what was found during the examination.  If the procedure report does not answer your questions, please call your gastroenterologist to clarify.  If you requested that your care partner not be given the details of your procedure findings, then the procedure report has been included in a sealed envelope for you to review at your convenience later.  YOU SHOULD EXPECT: Some feelings of bloating in the abdomen. Passage of more gas than usual.  Walking can help get rid of the air that was put into your GI tract during the procedure and reduce the bloating. If you had a lower endoscopy (such as a colonoscopy or flexible sigmoidoscopy) you may notice spotting of blood in your stool or on the toilet paper. If you underwent a bowel prep for your procedure, you may not have a normal bowel movement for a few days.  Please Note:  You might notice some irritation and congestion in your nose or some drainage.  This is from the oxygen used during your procedure.  There is no need for concern and it should clear up in a day or so.  SYMPTOMS TO REPORT IMMEDIATELY:   Following lower endoscopy (colonoscopy or flexible sigmoidoscopy):  Excessive amounts of blood in the stool  Significant tenderness or worsening of abdominal pains  Swelling of the abdomen that is new, acute  Fever of 100F or higher   For urgent or emergent issues, a gastroenterologist can be reached at any hour by calling [phone].   DIET:  We do recommend a small meal at first, but then you may proceed to your regular diet.  Drink plenty of fluids but you should avoid alcoholic beverages for 24 hours.  MEDICATIONS: Continue present medications.  Please see handouts given to you by your recovery nurse.  ACTIVITY:  You should plan to take it easy for the rest of today and you should  NOT DRIVE or use heavy machinery until tomorrow (because of the sedation medicines used during the test).    FOLLOW UP: Our staff will call the number listed on your records 48-72 hours following your procedure to check on you and address any questions or concerns that you may have regarding the information given to you following your procedure. If we do not reach you, we will leave a message.  We will attempt to reach you two times.  During this call, we will ask if you have developed any symptoms of COVID 19. If you develop any symptoms (ie: fever, flu-like symptoms, shortness of breath, cough etc.) before then, please call [phone].  If you test positive for Covid 19 in the 2 weeks post procedure, please call and report this information to us.    If any biopsies were taken you will be contacted by phone or by letter within the next 1-3 weeks.  Please call us at [phone] if you have not heard about the biopsies in 3 weeks.   Thank you for allowing us to provide for your healthcare needs today.   SIGNATURES/CONFIDENTIALITY: You and/or your care partner have signed paperwork which will be entered into your electronic medical record.  These signatures attest to the fact that that the information above on your After Visit Summary has been reviewed and is understood.  Full responsibility of the confidentiality of this discharge information lies with you and/or   your care-partner. 

## 2019-06-02 ENCOUNTER — Telehealth: Payer: Self-pay

## 2019-06-02 NOTE — Telephone Encounter (Signed)
  Follow up Call-  Call back number 05/31/2019  Post procedure Call Back phone  # [phone]  Permission to leave phone message Yes  Some recent data might be hidden     Patient questions:  Do you have a fever, pain , or abdominal swelling? No. Pain Score  0 *  Have you tolerated food without any problems? Yes.    Have you been able to return to your normal activities? Yes.    Do you have any questions about your discharge instructions: Diet   No. Medications  No. Follow up visit  No.  Do you have questions or concerns about your Care? No.  Actions: * If pain score is 4 or above: No action needed, pain <4.  1. Have you developed a fever since your procedure? no  2.   Have you had an respiratory symptoms (SOB or cough) since your procedure? no  3.   Have you tested positive for COVID 19 since your procedure no  4.   Have you had any family members/close contacts diagnosed with the COVID 19 since your procedure?  no   If yes to any of these questions please route to Joylene John, RN and Alphonsa Gin, Therapist, sports.

## 2019-06-10 ENCOUNTER — Encounter: Payer: Self-pay | Admitting: Gastroenterology

## 2019-10-11 ENCOUNTER — Encounter: Payer: BLUE CROSS/BLUE SHIELD | Admitting: Family Medicine

## 2019-10-18 ENCOUNTER — Other Ambulatory Visit: Payer: Self-pay | Admitting: Family Medicine

## 2019-10-19 ENCOUNTER — Other Ambulatory Visit: Payer: Self-pay | Admitting: Family Medicine

## 2019-10-22 ENCOUNTER — Other Ambulatory Visit: Payer: Self-pay | Admitting: Family Medicine

## 2019-10-26 LAB — NOVEL CORONAVIRUS, NAA: SARS-CoV-2, NAA: NEGATIVE

## 2020-02-06 ENCOUNTER — Other Ambulatory Visit: Payer: Self-pay

## 2020-02-06 ENCOUNTER — Encounter: Payer: Self-pay | Admitting: Family Medicine

## 2020-02-06 ENCOUNTER — Ambulatory Visit (INDEPENDENT_AMBULATORY_CARE_PROVIDER_SITE_OTHER): Payer: BC Managed Care – PPO | Admitting: Family Medicine

## 2020-02-06 ENCOUNTER — Telehealth: Payer: Self-pay | Admitting: Family Medicine

## 2020-02-06 VITALS — BP 132/80 | HR 60 | Temp 97.3°F | Ht 73.0 in | Wt 223.2 lb

## 2020-02-06 DIAGNOSIS — Z125 Encounter for screening for malignant neoplasm of prostate: Secondary | ICD-10-CM | POA: Diagnosis not present

## 2020-02-06 DIAGNOSIS — I10 Essential (primary) hypertension: Secondary | ICD-10-CM | POA: Diagnosis not present

## 2020-02-06 DIAGNOSIS — Z0000 Encounter for general adult medical examination without abnormal findings: Secondary | ICD-10-CM | POA: Diagnosis not present

## 2020-02-06 DIAGNOSIS — M1000 Idiopathic gout, unspecified site: Secondary | ICD-10-CM

## 2020-02-06 DIAGNOSIS — E785 Hyperlipidemia, unspecified: Secondary | ICD-10-CM

## 2020-02-06 LAB — COMPREHENSIVE METABOLIC PANEL
ALT: 21 U/L (ref 0–53)
AST: 20 U/L (ref 0–37)
Albumin: 4.5 g/dL (ref 3.5–5.2)
Alkaline Phosphatase: 86 U/L (ref 39–117)
BUN: 14 mg/dL (ref 6–23)
CO2: 28 mEq/L (ref 19–32)
Calcium: 9.4 mg/dL (ref 8.4–10.5)
Chloride: 100 mEq/L (ref 96–112)
Creatinine, Ser: 1.08 mg/dL (ref 0.40–1.50)
GFR: 68.97 mL/min (ref >60.00)
Glucose, Bld: 87 mg/dL (ref 70–99)
Potassium: 4.1 mEq/L (ref 3.5–5.1)
Sodium: 138 mEq/L (ref 135–145)
Total Bilirubin: 0.9 mg/dL (ref 0.2–1.2)
Total Protein: 7.4 g/dL (ref 6.0–8.3)

## 2020-02-06 LAB — URIC ACID: Uric Acid, Serum: 5.9 mg/dL (ref 4.0–7.8)

## 2020-02-06 LAB — LIPID PANEL
Cholesterol: 219 mg/dL — ABNORMAL HIGH (ref 0–200)
HDL: 41.5 mg/dL (ref >39.00)
Total CHOL/HDL Ratio: 5
Triglycerides: 472 mg/dL — ABNORMAL HIGH (ref 0.0–149.0)

## 2020-02-06 LAB — PSA: PSA: 1.41 ng/mL (ref 0.10–4.00)

## 2020-02-06 LAB — CBC WITH DIFFERENTIAL/PLATELET
Basophils Absolute: 0 10*3/uL (ref 0.0–0.1)
Basophils Relative: 0.6 % (ref 0.0–3.0)
Eosinophils Absolute: 0.1 10*3/uL (ref 0.0–0.7)
Eosinophils Relative: 2.3 % (ref 0.0–5.0)
HCT: 44.1 % (ref 39.0–52.0)
Hemoglobin: 15.4 g/dL (ref 13.0–17.0)
Lymphocytes Relative: 34.7 % (ref 12.0–46.0)
Lymphs Abs: 2.1 10*3/uL (ref 0.7–4.0)
MCHC: 34.9 g/dL (ref 30.0–36.0)
MCV: 93.4 fl (ref 78.0–100.0)
Monocytes Absolute: 0.6 10*3/uL (ref 0.1–1.0)
Monocytes Relative: 9.4 % (ref 3.0–12.0)
Neutro Abs: 3.2 10*3/uL (ref 1.4–7.7)
Neutrophils Relative %: 53 % (ref 43.0–77.0)
Platelets: 174 10*3/uL (ref 150.0–400.0)
RBC: 4.72 Mil/uL (ref 4.22–5.81)
RDW: 12.9 % (ref 11.5–15.5)
WBC: 6.1 10*3/uL (ref 4.0–10.5)

## 2020-02-06 LAB — LDL CHOLESTEROL, DIRECT: Direct LDL: 114 mg/dL

## 2020-02-06 MED ORDER — MELOXICAM 15 MG PO TABS: 15.0000 mg | ORAL_TABLET | Freq: Every day | ORAL | 0 refills | Status: DC | PRN

## 2020-02-06 NOTE — Patient Instructions (Addendum)
Please stop by lab before you go If you do not have mychart- we will call you about results within 5 business days of [place] receiving them.  If you have mychart- we will send your results within 3 business days of [place] receiving them.  If abnormal or we want to clarify a result, we will call or mychart you to make sure you receive the message.  If you have questions or concerns or don't hear within 5 business days, please send [place] a message or call us.   You can call back to schedule shingrix series  (here or pharmacy is fine) but needs to be at least 2 weeks out from last covid vaccine. Please let us know when you get the covid vaccine through mychart.    Recommended follow up: Return in about 1 year (around 02/05/2021) for physical or sooner if needed. he agrees to continue to monitor blood pressure at home. Marland Kitchen

## 2020-02-06 NOTE — Telephone Encounter (Signed)
Patient has called back in regard to labs.  I have given him Dr. Ronney Lion response.  Patient understood.

## 2020-02-06 NOTE — Progress Notes (Signed)
Phone: [phone]   Subjective:  Patient presents today for their annual physical. Chief complaint-noted.   See problem oriented charting- Review of Systems  Constitutional: Negative for chills and fever.  HENT: Negative for ear pain, hearing loss and tinnitus.   Eyes: Negative for blurred vision, double vision and photophobia.  Respiratory: Negative for cough, shortness of breath and wheezing.   Cardiovascular: Negative for chest pain, palpitations and leg swelling.  Gastrointestinal: Negative for heartburn, nausea and vomiting.  Genitourinary: Negative for dysuria, frequency and urgency.  Musculoskeletal: Negative for back pain, joint pain, myalgias and neck pain.  Skin: Negative for itching and rash.  Neurological: Negative for dizziness, seizures, weakness and headaches.  Endo/Heme/Allergies: Does not bruise/bleed easily.  Psychiatric/Behavioral: Negative for depression, memory loss and suicidal ideas. The patient does not have insomnia.   occasional joint pain   The following were reviewed and entered/updated in epic: Past Medical History:  Diagnosis Date  . Gout   . Hypertension   . OSA (obstructive sleep apnea) 04/29/2017  . Sleep apnea    uses CPAP   Patient Active Problem List   Diagnosis Date Noted  . Hyperlipidemia 05/05/2016    Priority: Medium  . Essential hypertension 01/24/2008    Priority: Medium  . Gout 10/25/2014    Priority: Low  . History of basal cell carcinoma of skin 11/10/2017  . OSA (obstructive sleep apnea) 04/29/2017  . Hypersomnia 02/20/2017  . Acute pain of right knee 10/31/2016   Past Surgical History:  Procedure Laterality Date  . COLONOSCOPY  2010,03/03/2014  . POLYPECTOMY    . ROTATOR CUFF REPAIR     left years ago    Family History  Problem Relation Age of Onset  . Hypertension Mother   . HIV Brother   . Other Father        unknown- left family when patient age 7  . Colon cancer Neg Hx   . Pancreatic cancer Neg Hx   .  Rectal cancer Neg Hx   . Stomach cancer Neg Hx   . Colon polyps Neg Hx     Medications- reviewed and updated Current Outpatient Medications  Medication Sig Dispense Refill  . allopurinol (ZYLOPRIM) 100 MG tablet TAKE 1 TABLET BY MOUTH EVERY DAY 90 tablet 2  . amLODipine (NORVASC) 5 MG tablet TAKE 1 TABLET BY MOUTH EVERY DAY 90 tablet 3  . aspirin 81 MG tablet Take 81 mg by mouth daily.    Marland Kitchen atenolol (TENORMIN) 25 MG tablet TAKE 1 TABLET BY MOUTH EVERY DAY 90 tablet 3  . MULTIPLE VITAMIN PO Take 1 tablet by mouth daily.    . meloxicam (MOBIC) 15 MG tablet Take 1 tablet (15 mg total) by mouth daily as needed for pain (gout flares). 30 tablet 0   No current facility-administered medications for this visit.    Allergies-reviewed and updated No Known Allergies  Social History   Social History Narrative   Married 42 years in 2021. 2 kids grown. 3 grandkids in 2017.    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 Enjoys vacations and family time   Objective  Objective:  BP 132/80   Pulse 60   Temp (!) 97.3 F (36.3 C) (Temporal)   Ht 6\' 1"  (1.854 m)   Wt 223 lb 3.2 oz (101.2 kg)   SpO2 97%   BMI 29.45 kg/m  Gen: NAD, resting comfortably HEENT: Mucous membranes are moist. Oropharynx normal Neck: no thyromegaly CV: RRR no murmurs rubs or gallops Lungs: CTAB no crackles, wheeze, rhonchi Abdomen: soft/nontender/nondistended/normal bowel sounds. No rebound or guarding.  Ext: no edema Skin: warm, dry Neuro: grossly normal, moves all extremities, PERRLA    Assessment and Plan  64 y.o. male presenting for annual physical.  Health Maintenance counseling: 1. Anticipatory guidance: Patient counseled regarding regular dental exams q6 months, eye exams yearly,  avoiding smoking and second hand smoke , limiting alcohol to 2 beverages per day . Never drinks   2. Risk factor reduction:  Advised  patient of need for regular exercise and diet rich and fruits and vegetables to reduce risk of heart attack and stroke. Exercise- works outside [employer] Diet- feels like he has healthy die- pretty much eats what wife makes- thinks could cut down on breads. He does think he will lose about 10 lbs being active with mowing in upcoming months.  Wt Readings from Last 3 Encounters:  02/06/20 223 lb 3.2 oz (101.2 kg)  05/31/19 215 lb (97.5 kg)  05/12/19 215 lb (97.5 kg)  3. Immunizations/screenings/ancillary studies-we discussed Shingrix- discussed could do this at least 2 weeks after completing covid 19 vaccines.  We discussed COVID-19 vaccination has appointment to start next week.  Immunization History  Administered Date(s) Administered  . Influenza Split 08/03/2017  . Influenza Whole 09/21/2010, 09/06/2011  . Influenza,inj,Quad PF,6+ Mos 07/27/2018, 07/09/2019  . Influenza-Unspecified 08/12/2016, 07/27/2018  . Td 01/24/2008  . Tdap 10/08/2018  4. Prostate cancer screening- prior PSA trend was low risk.  Update PSA alone today-defer rectal unless concerning PSA trend Lab Results  Component Value Date   PSA 1.38 10/08/2018   PSA 1.38 06/24/2017   PSA 1.20 04/28/2016   5. Colon cancer screening - done in 05/31/2019 normal not due till 2030.  6. Skin cancer screening-previously has seen [facility] dermatology yearly [facility]-she has transferred to [facility] surgery [facility]-patient states he will be seeing Dr. Allyson Sabal now.  Advised regular sunscreen use. Denies worrisome, changing, or new skin lesions.  7.  Never smoker  Status of chronic or acute concerns   #Gout S: no flares in  on allopurinol 100 mg. Does not have Meloxicam available for flares new script  given.  Lab Results  Component Value Date   LABURIC 8.6 (H) 06/24/2017  A/P:hopefully uric acid under 6- considering he has had no flares on 100mg  likely will keep same dose unless level high and has more flare ups    #hypertension S: compliant  with amlodipine 5 mg and atenolol 25 mg. Does check readings at home. Has been between 127/70 to 138/80. Has not missed any doses of medications. Patient denies any chest pain, shortness of breath, changes or changes in vision. Does not add salt to food. Tries to maintain a heart healthy diet.  BP Readings from Last 3 Encounters:  02/06/20 132/80  05/31/19 100/68  02/04/19 129/77  A/P: Stable. Continue current medications.    #hyperlipidemia S: Not currently on treatment. Lab Results  Component Value Date   CHOL 212 (H) 10/08/2018   HDL 44.30 10/08/2018   LDLCALC 136 (H) 10/08/2018   LDLDIRECT 108.0 06/24/2017   TRIG 156.0 (H) 10/08/2018   CHOLHDL 5 10/08/2018   A/P: We will update lipids and 10-year ASCVD risk.  Prior 10-year ASCVD risk based off last lipids and today's blood pressure is15.1%.  We discussed above 7.5% considering statin. - he would like to hold  off on medicine for now- he is going to try to cut out lunch meat- and try peanut butter sandwich instead- we agreed if risk gets baove 20% to start medicine - no family history of CAD   Recommended follow up: Return in about 1 year (around 02/05/2021) for physical or sooner if needed. he agrees to continue to monitor blood pressure at home. .  Lab/Order associations: not fasting- OJ and 2 frosted mini wheats   ICD-10-CM   1. Preventative health care  Z00.00 CBC with Differential/Platelet    Comprehensive metabolic panel    Lipid panel    PSA  2. Hyperlipidemia, unspecified hyperlipidemia type  E78.5 CBC with Differential/Platelet    Comprehensive metabolic panel    Lipid panel  3. Essential hypertension  I10   4. Idiopathic gout, unspecified chronicity, unspecified site  M10.00 meloxicam (MOBIC) 15 MG tablet    Uric acid  5. Screening for prostate cancer  Z12.5 PSA    Meds ordered this encounter  Medications  . meloxicam (MOBIC) 15 MG tablet    Sig: Take 1 tablet (15 mg total) by mouth daily as needed for pain (gout  flares).    Dispense:  30 tablet    Refill:  0   Return precautions advised.  [name], MD

## 2020-02-29 ENCOUNTER — Other Ambulatory Visit: Payer: Self-pay | Admitting: Family Medicine

## 2020-02-29 DIAGNOSIS — M1000 Idiopathic gout, unspecified site: Secondary | ICD-10-CM

## 2020-07-23 ENCOUNTER — Other Ambulatory Visit: Payer: Self-pay | Admitting: Family Medicine

## 2020-07-23 DIAGNOSIS — M1000 Idiopathic gout, unspecified site: Secondary | ICD-10-CM

## 2020-10-20 ENCOUNTER — Other Ambulatory Visit: Payer: Self-pay | Admitting: Family Medicine

## 2021-02-04 NOTE — Patient Instructions (Addendum)
Schedule a lab visit at the check out desk within 1 week. Return for future fasting labs meaning nothing but water after midnight please. Ok to take your medications with water.    Health Maintenance Due  Topic Date Due  . COVID-19 Vaccine (2 - Pfizer 3-dose series) Will call back with the dates.  11/19/2020   Shingrix #1 today. Repeat injection in 2-5 months. Schedule a nurse visit for the 2nd injection before you leave today (at the check out desk)  We will call you within two weeks about your referral to coronary calcium scoring. If you do not hear within 2 weeks, give [place] a call.   Keep eye on home blood pressure- we prefer <135/85 at home on average  Recommended follow up: Return in about 1 year (around 02/07/2022) for physical or sooner if needed.

## 2021-02-04 NOTE — Progress Notes (Signed)
Phone: [phone]   Subjective:  Patient presents today for their annual physical. Chief complaint-noted.   See problem oriented charting- ROS- full  review of systems was completed and negative per full ROS sheet  The following were reviewed and entered/updated in epic: Past Medical History:  Diagnosis Date  . Gout   . Hypertension   . OSA (obstructive sleep apnea) 04/29/2017  . Sleep apnea    uses CPAP   Patient Active Problem List   Diagnosis Date Noted  . Hyperlipidemia 05/05/2016    Priority: Medium  . Essential hypertension 01/24/2008    Priority: Medium  . Gout 10/25/2014    Priority: Low  . History of basal cell carcinoma of skin 11/10/2017  . OSA (obstructive sleep apnea) 04/29/2017  . Hypersomnia 02/20/2017  . Acute pain of right knee 10/31/2016   Past Surgical History:  Procedure Laterality Date  . COLONOSCOPY  2010,03/03/2014  . POLYPECTOMY    . ROTATOR CUFF REPAIR     left years ago    Family History  Problem Relation Age of Onset  . Hypertension Mother   . HIV Brother   . Other Father        unknown- left family when patient age 39  . Colon cancer Neg Hx   . Pancreatic cancer Neg Hx   . Rectal cancer Neg Hx   . Stomach cancer Neg Hx   . Colon polyps Neg Hx     Medications- reviewed and updated Current Outpatient Medications  Medication Sig Dispense Refill  . allopurinol (ZYLOPRIM) 100 MG tablet TAKE 1 TABLET BY MOUTH EVERY DAY 90 tablet 2  . amLODipine (NORVASC) 5 MG tablet TAKE 1 TABLET BY MOUTH EVERY DAY 90 tablet 3  . aspirin 81 MG tablet Take 81 mg by mouth daily.    Marland Kitchen atenolol (TENORMIN) 25 MG tablet TAKE 1 TABLET BY MOUTH EVERY DAY 90 tablet 3  . meloxicam (MOBIC) 15 MG tablet TAKE 1 TABLET (15 MG TOTAL) BY MOUTH DAILY AS NEEDED FOR PAIN (GOUT FLARES). 30 tablet 0  . MULTIPLE VITAMIN PO Take 1 tablet by mouth daily.     No current facility-administered medications for this visit.    Allergies-reviewed and updated No Known  Allergies  Social History   Social History Narrative   Married 42 years in 2021. 2 kids grown. 4 grandkids in 2022.    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 Enjoys vacations and family time   Objective  Objective:  BP 132/86   Pulse 60   Temp (!) 97.3 F (36.3 C) (Temporal)   Ht 6\' 1"  (1.854 m)   Wt 215 lb (97.5 kg)   SpO2 98%   BMI 28.37 kg/m  Gen: NAD, resting comfortably HEENT: Mucous membranes are moist. Oropharynx normal Neck: no thyromegaly CV: RRR no murmurs rubs or gallops Lungs: CTAB no crackles, wheeze, rhonchi Abdomen: soft/nontender/nondistended/normal bowel sounds. No rebound or guarding.  Ext: no edema Skin: warm, dry Neuro: grossly normal, moves all extremities, PERRLA   Assessment and Plan  65 y.o. male presenting for annual physical.  Health Maintenance counseling: 1. Anticipatory guidance: Patient counseled regarding regular dental exams -q6 months, eye exams -yearly,  avoiding smoking and second hand smoke , limiting alcohol to 2 beverages per day-does not drink, no drugs.   2. Risk factor reduction:  Advised patient of need for regular  exercise and diet rich and fruits and vegetables to reduce risk of heart attack and stroke. 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 Diet-down 8 lbs from last year- trying to eat healthier choices and reducing portion sizes.  Wt Readings from Last 3 Encounters:  02/07/21 215 lb (97.5 kg)  02/06/20 223 lb 3.2 oz (101.2 kg)  05/31/19 215 lb (97.5 kg)  3. Immunizations/screenings/ancillary studies-discussed Shingrix - he opts in.  Discussed COVID-19 vaccination/boosters-he has already had 2 and 1 booster-discussed potential second booster Immunization History  Administered Date(s) Administered  . Influenza Split 08/03/2017  . Influenza Whole 09/21/2010, 09/06/2011  . Influenza,inj,Quad PF,6+ Mos 07/27/2018, 07/09/2019  .  Influenza-Unspecified 08/12/2016, 07/27/2018  . PFIZER(Purple Top)SARS-COV-2 Vaccination 10/29/2020  . Td 01/24/2008  . Tdap 10/08/2018  4. Prostate cancer screening- prior PSA trend has been low risk.  Update PSA today-defer rectal unless PSA trend concerning Lab Results  Component Value Date   PSA 1.41 02/06/2020   PSA 1.38 10/08/2018   PSA 1.38 06/24/2017   5. Colon cancer screening - completed 05/31/2019 with repeat in 10 years planned 6. Skin cancer screening-patient follows with [facility] dermatology Dr. Neville Route regular sunscreen use. Denies worrisome, changing, or new skin lesions.  7.  Never smoker 8. STD screening - only active with wife-not needed  Status of chronic or acute concerns   #hypertension S: medication: Amlodipine 5Mg , Atenolol 25g Home readings #s: Has home cuff-usually 130s over 70s or 80s. Checks every few weeks on blood pressure A/P: Initial blood pressure slightly high-on repeat well-controlled. Continue current meds  #hyperlipidemia S: Medication: None-has preferred to work on lifestyle in the past and only start statin if risk is above 20% per ASCVD risk estimator Lab Results  Component Value Date   CHOL 219 (H) 02/06/2020   HDL 41.50 02/06/2020   LDLCALC 136 (H) 10/08/2018   LDLDIRECT 114.0 02/06/2020   TRIG (H) 02/06/2020    472.0 Triglyceride is over 400; calculations on Lipids are invalid.   CHOLHDL 5 02/06/2020  A/P: Has been elevated in the past-we can update today as he has tried to improve his diet and has lost some weight- we will get a coronary calcium scoring test for more info  #Gout S: 0 flares in last year on Allopurinol 100Mg .  Meloxicam available for flares A/P:Stable. Continue current medications.  Update uric acid   Recommended follow up: Return in about 1 year (around 02/07/2022) for physical or sooner if needed.  Lab/Order associations: Not fasting today-handful of dry cheerios and OJ    ICD-10-CM   1. Preventative health care   Z00.00 Comprehensive metabolic panel    CBC with Differential/Platelet    Lipid panel    PSA    Uric acid  2. Essential hypertension  I10 CBC with Differential/Platelet  3. Hyperlipidemia, unspecified hyperlipidemia type  E78.5 Lipid panel    CT CARDIAC SCORING (SELF PAY ONLY)  4. Idiopathic gout, unspecified chronicity, unspecified site  M10.00 Uric acid  5. Screening for prostate cancer  Z12.5 PSA    No orders of the defined types were placed in this encounter.   Return precautions advised.  [name], MD

## 2021-02-07 ENCOUNTER — Encounter: Payer: Self-pay | Admitting: Family Medicine

## 2021-02-07 ENCOUNTER — Other Ambulatory Visit: Payer: Self-pay

## 2021-02-07 ENCOUNTER — Ambulatory Visit (INDEPENDENT_AMBULATORY_CARE_PROVIDER_SITE_OTHER): Payer: BC Managed Care – PPO | Admitting: Family Medicine

## 2021-02-07 VITALS — BP 132/86 | HR 60 | Temp 97.3°F | Ht 73.0 in | Wt 215.0 lb

## 2021-02-07 DIAGNOSIS — Z0000 Encounter for general adult medical examination without abnormal findings: Secondary | ICD-10-CM | POA: Diagnosis not present

## 2021-02-07 DIAGNOSIS — E785 Hyperlipidemia, unspecified: Secondary | ICD-10-CM

## 2021-02-07 DIAGNOSIS — Z23 Encounter for immunization: Secondary | ICD-10-CM

## 2021-02-07 DIAGNOSIS — I10 Essential (primary) hypertension: Secondary | ICD-10-CM | POA: Diagnosis not present

## 2021-02-07 DIAGNOSIS — M1000 Idiopathic gout, unspecified site: Secondary | ICD-10-CM | POA: Diagnosis not present

## 2021-02-07 DIAGNOSIS — Z125 Encounter for screening for malignant neoplasm of prostate: Secondary | ICD-10-CM

## 2021-02-07 NOTE — Addendum Note (Signed)
Addended by: Gean Birchwood on: 02/07/2021 08:34 AM   Modules accepted: Orders

## 2021-02-08 ENCOUNTER — Other Ambulatory Visit (INDEPENDENT_AMBULATORY_CARE_PROVIDER_SITE_OTHER): Payer: BC Managed Care – PPO

## 2021-02-08 DIAGNOSIS — M1000 Idiopathic gout, unspecified site: Secondary | ICD-10-CM | POA: Diagnosis not present

## 2021-02-08 DIAGNOSIS — Z125 Encounter for screening for malignant neoplasm of prostate: Secondary | ICD-10-CM

## 2021-02-08 DIAGNOSIS — I10 Essential (primary) hypertension: Secondary | ICD-10-CM | POA: Diagnosis not present

## 2021-02-08 DIAGNOSIS — E785 Hyperlipidemia, unspecified: Secondary | ICD-10-CM

## 2021-02-08 DIAGNOSIS — Z0000 Encounter for general adult medical examination without abnormal findings: Secondary | ICD-10-CM | POA: Diagnosis not present

## 2021-02-08 LAB — LIPID PANEL
Cholesterol: 183 mg/dL (ref 0–200)
HDL: 39.4 mg/dL (ref >39.00)
NonHDL: 143.24
Total CHOL/HDL Ratio: 5
Triglycerides: 217 mg/dL — ABNORMAL HIGH (ref 0.0–149.0)
VLDL: 43.4 mg/dL — ABNORMAL HIGH (ref 0.0–40.0)

## 2021-02-08 LAB — CBC WITH DIFFERENTIAL/PLATELET
Basophils Absolute: 0 10*3/uL (ref 0.0–0.1)
Basophils Relative: 0.3 % (ref 0.0–3.0)
Eosinophils Absolute: 0.1 10*3/uL (ref 0.0–0.7)
Eosinophils Relative: 1.4 % (ref 0.0–5.0)
HCT: 41.8 % (ref 39.0–52.0)
Hemoglobin: 14.5 g/dL (ref 13.0–17.0)
Lymphocytes Relative: 16.9 % (ref 12.0–46.0)
Lymphs Abs: 1.1 10*3/uL (ref 0.7–4.0)
MCHC: 34.6 g/dL (ref 30.0–36.0)
MCV: 93.4 fl (ref 78.0–100.0)
Monocytes Absolute: 0.7 10*3/uL (ref 0.1–1.0)
Monocytes Relative: 10.1 % (ref 3.0–12.0)
Neutro Abs: 4.7 10*3/uL (ref 1.4–7.7)
Neutrophils Relative %: 71.3 % (ref 43.0–77.0)
Platelets: 160 10*3/uL (ref 150.0–400.0)
RBC: 4.48 Mil/uL (ref 4.22–5.81)
RDW: 13.2 % (ref 11.5–15.5)
WBC: 6.7 10*3/uL (ref 4.0–10.5)

## 2021-02-08 LAB — LDL CHOLESTEROL, DIRECT: Direct LDL: 120 mg/dL

## 2021-02-08 LAB — COMPREHENSIVE METABOLIC PANEL
ALT: 25 U/L (ref 0–53)
AST: 21 U/L (ref 0–37)
Albumin: 4.3 g/dL (ref 3.5–5.2)
Alkaline Phosphatase: 91 U/L (ref 39–117)
BUN: 12 mg/dL (ref 6–23)
CO2: 30 mEq/L (ref 19–32)
Calcium: 9.4 mg/dL (ref 8.4–10.5)
Chloride: 100 mEq/L (ref 96–112)
Creatinine, Ser: 1.06 mg/dL (ref 0.40–1.50)
GFR: 74.22 mL/min (ref >60.00)
Glucose, Bld: 90 mg/dL (ref 70–99)
Potassium: 4.1 mEq/L (ref 3.5–5.1)
Sodium: 136 mEq/L (ref 135–145)
Total Bilirubin: 1.3 mg/dL — ABNORMAL HIGH (ref 0.2–1.2)
Total Protein: 7.2 g/dL (ref 6.0–8.3)

## 2021-02-08 LAB — URIC ACID: Uric Acid, Serum: 6.2 mg/dL (ref 4.0–7.8)

## 2021-02-08 LAB — PSA: PSA: 1.52 ng/mL (ref 0.10–4.00)

## 2021-04-15 ENCOUNTER — Other Ambulatory Visit: Payer: Self-pay | Admitting: Family Medicine

## 2021-05-02 ENCOUNTER — Ambulatory Visit (INDEPENDENT_AMBULATORY_CARE_PROVIDER_SITE_OTHER)
Admission: RE | Admit: 2021-05-02 | Discharge: 2021-05-02 | Disposition: A | Discharge status: Home / Self Care | Payer: Self-pay | Source: Ambulatory Visit | Attending: Family Medicine | Admitting: Family Medicine

## 2021-05-02 ENCOUNTER — Other Ambulatory Visit: Payer: Self-pay

## 2021-05-02 DIAGNOSIS — E785 Hyperlipidemia, unspecified: Secondary | ICD-10-CM

## 2021-05-03 NOTE — Progress Notes (Signed)
LMTCB

## 2021-05-09 ENCOUNTER — Other Ambulatory Visit: Payer: Self-pay

## 2021-05-09 ENCOUNTER — Ambulatory Visit (INDEPENDENT_AMBULATORY_CARE_PROVIDER_SITE_OTHER): Payer: BC Managed Care – PPO | Admitting: *Deleted

## 2021-05-09 ENCOUNTER — Telehealth: Payer: Self-pay | Admitting: *Deleted

## 2021-05-09 ENCOUNTER — Encounter: Payer: Self-pay | Admitting: Family Medicine

## 2021-05-09 ENCOUNTER — Encounter: Payer: Self-pay | Admitting: *Deleted

## 2021-05-09 DIAGNOSIS — Z23 Encounter for immunization: Secondary | ICD-10-CM

## 2021-05-09 MED ORDER — ROSUVASTATIN CALCIUM 5 MG PO TABS: 5.0000 mg | ORAL_TABLET | Freq: Every day | ORAL | 3 refills | Status: DC

## 2021-05-09 NOTE — Progress Notes (Signed)
Per orders of Dr. Yong Channel, injection of Shingrix 0.5 ml given IM by Anselmo Pickler, LPN in left deltoid. Patient tolerated injection well. Patient has completed the series.

## 2021-05-09 NOTE — Telephone Encounter (Signed)
Pt presented to office today for Shingrix shot. Pt asked about results of CT Cardiac Scoring test said someone left him a message. Told him per Dr. Yong Channel: Notes from Dr. Yong Channel:  You do have some plaque on your heart-you are at the 38th percentile for your age.  The recommendation is to consider cholesterol medicine if you have any plaque on the heart-I would recommend we trial very low-dose rosuvastatin 5 mg daily #90 with 3 refills if you are willing.  All of the plaque noted was on the left anterior descending artery which is the "widow maker"-that does not mean you would have a heart attack anytime soon but starting a cholesterolmedicine could reduce your risk long-term   There also was an incidental finding of a slight aneurysm of the thoracic aorta-they recommended CT angiogram or MR angiogram in 1 year.  We may need to be more aggressive about blood pressure control in future visits due to this-ideally with aneurysms we keep blood pressure under 120.  Also should avoid certain class of medications that can make aneurysms worse calledquinolone antibiotics. Pt verbalized understanding and said he will think about the cholesterol medication and let us know what he wants to do. Told him okay.

## 2021-07-23 ENCOUNTER — Other Ambulatory Visit: Payer: Self-pay | Admitting: Family Medicine

## 2021-10-11 ENCOUNTER — Other Ambulatory Visit: Payer: Self-pay | Admitting: Family Medicine

## 2021-10-16 DIAGNOSIS — I10 Essential (primary) hypertension: Secondary | ICD-10-CM | POA: Diagnosis not present

## 2021-10-16 DIAGNOSIS — E785 Hyperlipidemia, unspecified: Secondary | ICD-10-CM | POA: Diagnosis not present

## 2021-10-16 DIAGNOSIS — Z008 Encounter for other general examination: Secondary | ICD-10-CM | POA: Diagnosis not present

## 2021-10-16 DIAGNOSIS — M109 Gout, unspecified: Secondary | ICD-10-CM | POA: Diagnosis not present

## 2021-10-16 DIAGNOSIS — Z8249 Family history of ischemic heart disease and other diseases of the circulatory system: Secondary | ICD-10-CM | POA: Diagnosis not present

## 2021-10-16 DIAGNOSIS — Z7982 Long term (current) use of aspirin: Secondary | ICD-10-CM | POA: Diagnosis not present

## 2022-01-14 ENCOUNTER — Other Ambulatory Visit: Payer: Self-pay | Admitting: Family Medicine

## 2022-02-11 ENCOUNTER — Encounter: Payer: BC Managed Care – PPO | Admitting: Family Medicine

## 2022-02-21 ENCOUNTER — Ambulatory Visit (INDEPENDENT_AMBULATORY_CARE_PROVIDER_SITE_OTHER): Payer: Medicare HMO | Admitting: Family Medicine

## 2022-02-21 ENCOUNTER — Encounter: Payer: Self-pay | Admitting: Family Medicine

## 2022-02-21 VITALS — BP 126/70 | HR 72 | Temp 98.7°F | Ht 73.0 in | Wt 217.0 lb

## 2022-02-21 DIAGNOSIS — N390 Urinary tract infection, site not specified: Secondary | ICD-10-CM | POA: Diagnosis not present

## 2022-02-21 LAB — POCT URINALYSIS DIPSTICK
Bilirubin, UA: NEGATIVE
Blood, UA: POSITIVE
Glucose, UA: NEGATIVE
Ketones, UA: NEGATIVE
Nitrite, UA: NEGATIVE
Protein, UA: POSITIVE — AB
Spec Grav, UA: 1.02 (ref 1.010–1.025)
Urobilinogen, UA: 0.2 E.U./dL
pH, UA: 6 (ref 5.0–8.0)

## 2022-02-21 MED ORDER — CIPROFLOXACIN HCL 500 MG PO TABS: 500.0000 mg | ORAL_TABLET | Freq: Two times a day (BID) | ORAL | 0 refills | Status: DC

## 2022-02-21 NOTE — Progress Notes (Signed)
? ?  Garrett Avila is a 66 y.o. male who presents today for an office visit. ? ?Assessment/Plan:  ?Urinary frequency ?Likely has UTI.  He appears well today though does have occasional malaise.  No signs of overt pyelonephritis though given his occasional subjective fevers and chills we will head and empirically treat with course of ciprofloxacin even though his point-of-care UA does not have any significant findings.  Urine culture is pending.  We discussed potential side effects.  Prostatitis is also on the differential though he is not having any perineal pain.  If symptoms return after initial course of antibiotics would consider extending course to treat for underlying prostatitis. We encouraged hydration.  We discussed reasons to return to care and seek emergent care. ? ?  ?Subjective:  ?HPI: ? ?Patient here with concern for UTI. Started about 3 days ago. No dysuria. He has noticed increased hesitancy if he sits to urinate.  Some increased frequency as well.  Wakes up twice per night to urinate. HE has had some subjective fevers and malaise. Some chills last night. He does have some lower abdominal tenderness. No perineal pain.  ? ?   ?  ?Objective:  ?Physical Exam: ?BP 126/70   Pulse 72   Temp 98.7 ?F (37.1 ?C) (Temporal)   Ht '6\' 1"'$  (1.854 m)   Wt 217 lb (98.4 kg)   SpO2 97%   BMI 28.63 kg/m?   ?Gen: No acute distress, resting comfortably ?CV: Regular rate and rhythm with no murmurs appreciated ?Pulm: Normal work of breathing, clear to auscultation bilaterally with no crackles, wheezes, or rhonchi ?MSK: No CVA tenderness. ?Neuro: Grossly normal, moves all extremities ?Psych: Normal affect and thought content ? ?   ? ?Algis Greenhouse. Jerline Pain, MD ?02/21/2022 3:26 PM  ?

## 2022-02-24 ENCOUNTER — Telehealth: Payer: Self-pay | Admitting: Family Medicine

## 2022-02-24 LAB — URINE CULTURE
MICRO NUMBER:: 13296171
SPECIMEN QUALITY:: ADEQUATE

## 2022-02-24 NOTE — Telephone Encounter (Signed)
Pt states he would like a phone call with results of recent lab test when available. ?

## 2022-02-24 NOTE — Progress Notes (Signed)
Please inform patient of the following: ? ?His urine culture confirms UTI.  The antibiotic we have him on is probably not as effective as it needs to be.  Recommend switching to Macrobid 100 mg twice daily x7 days.  Please send in new prescription.  He should follow-up with [place] if not improving.

## 2022-02-24 NOTE — Telephone Encounter (Signed)
Pt was seen by Jerline Pain on 02/21/22. ? ?

## 2022-02-25 ENCOUNTER — Other Ambulatory Visit: Payer: Self-pay | Admitting: *Deleted

## 2022-02-25 MED ORDER — NITROFURANTOIN MONOHYD MACRO 100 MG PO CAPS: 100.0000 mg | ORAL_CAPSULE | Freq: Two times a day (BID) | ORAL | 0 refills | Status: AC

## 2022-02-26 NOTE — Telephone Encounter (Signed)
See lab notes

## 2022-03-20 ENCOUNTER — Encounter: Payer: Self-pay | Admitting: Family Medicine

## 2022-03-20 ENCOUNTER — Ambulatory Visit (INDEPENDENT_AMBULATORY_CARE_PROVIDER_SITE_OTHER): Payer: Medicare HMO | Admitting: Family Medicine

## 2022-03-20 VITALS — BP 136/84 | HR 58 | Temp 97.5°F | Ht 73.0 in | Wt 211.4 lb

## 2022-03-20 DIAGNOSIS — E785 Hyperlipidemia, unspecified: Secondary | ICD-10-CM

## 2022-03-20 DIAGNOSIS — I10 Essential (primary) hypertension: Secondary | ICD-10-CM | POA: Diagnosis not present

## 2022-03-20 DIAGNOSIS — Z0000 Encounter for general adult medical examination without abnormal findings: Secondary | ICD-10-CM | POA: Diagnosis not present

## 2022-03-20 DIAGNOSIS — Z23 Encounter for immunization: Secondary | ICD-10-CM | POA: Diagnosis not present

## 2022-03-20 DIAGNOSIS — M1000 Idiopathic gout, unspecified site: Secondary | ICD-10-CM | POA: Diagnosis not present

## 2022-03-20 DIAGNOSIS — R351 Nocturia: Secondary | ICD-10-CM | POA: Diagnosis not present

## 2022-03-20 DIAGNOSIS — I712 Thoracic aortic aneurysm, without rupture, unspecified: Secondary | ICD-10-CM

## 2022-03-20 LAB — CBC WITH DIFFERENTIAL/PLATELET
Basophils Absolute: 0 10*3/uL (ref 0.0–0.1)
Basophils Relative: 0.7 % (ref 0.0–3.0)
Eosinophils Absolute: 0.2 10*3/uL (ref 0.0–0.7)
Eosinophils Relative: 3.5 % (ref 0.0–5.0)
HCT: 41.7 % (ref 39.0–52.0)
Hemoglobin: 14 g/dL (ref 13.0–17.0)
Lymphocytes Relative: 38.1 % (ref 12.0–46.0)
Lymphs Abs: 2 10*3/uL (ref 0.7–4.0)
MCHC: 33.7 g/dL (ref 30.0–36.0)
MCV: 93.7 fl (ref 78.0–100.0)
Monocytes Absolute: 0.5 10*3/uL (ref 0.1–1.0)
Monocytes Relative: 9.8 % (ref 3.0–12.0)
Neutro Abs: 2.5 10*3/uL (ref 1.4–7.7)
Neutrophils Relative %: 47.9 % (ref 43.0–77.0)
Platelets: 158 10*3/uL (ref 150.0–400.0)
RBC: 4.44 Mil/uL (ref 4.22–5.81)
RDW: 13.7 % (ref 11.5–15.5)
WBC: 5.2 10*3/uL (ref 4.0–10.5)

## 2022-03-20 LAB — COMPREHENSIVE METABOLIC PANEL
ALT: 18 U/L (ref 0–53)
AST: 20 U/L (ref 0–37)
Albumin: 4.5 g/dL (ref 3.5–5.2)
Alkaline Phosphatase: 93 U/L (ref 39–117)
BUN: 16 mg/dL (ref 6–23)
CO2: 29 mEq/L (ref 19–32)
Calcium: 9.4 mg/dL (ref 8.4–10.5)
Chloride: 101 mEq/L (ref 96–112)
Creatinine, Ser: 1.15 mg/dL (ref 0.40–1.50)
GFR: 66.79 mL/min (ref >60.00)
Glucose, Bld: 98 mg/dL (ref 70–99)
Potassium: 4.1 mEq/L (ref 3.5–5.1)
Sodium: 137 mEq/L (ref 135–145)
Total Bilirubin: 1.3 mg/dL — ABNORMAL HIGH (ref 0.2–1.2)
Total Protein: 7.7 g/dL (ref 6.0–8.3)

## 2022-03-20 LAB — LIPID PANEL
Cholesterol: 140 mg/dL (ref 0–200)
HDL: 46.3 mg/dL (ref >39.00)
LDL Cholesterol: 64 mg/dL (ref 0–99)
NonHDL: 93.81
Total CHOL/HDL Ratio: 3
Triglycerides: 150 mg/dL — ABNORMAL HIGH (ref 0.0–149.0)
VLDL: 30 mg/dL (ref 0.0–40.0)

## 2022-03-20 LAB — URIC ACID: Uric Acid, Serum: 6.5 mg/dL (ref 4.0–7.8)

## 2022-03-20 LAB — PSA: PSA: 4 ng/mL (ref 0.10–4.00)

## 2022-03-20 NOTE — Progress Notes (Signed)
Phone: [phone]   Subjective:  Patient presents today for their annual physical. Chief complaint-noted.   See problem oriented charting- ROS- full  review of systems was completed and negative  except for: nocturia, some aches and pains  The following were reviewed and entered/updated in epic: Past Medical History:  Diagnosis Date   Gout    Hypertension    OSA (obstructive sleep apnea) 04/29/2017   Sleep apnea    uses CPAP   Patient Active Problem List   Diagnosis Date Noted   Thoracic aortic aneurysm ([place]) 03/20/2022    Priority: High   OSA (obstructive sleep apnea) 04/29/2017    Priority: Medium    Hyperlipidemia 05/05/2016    Priority: Medium    Essential hypertension 01/24/2008    Priority: Medium    History of basal cell carcinoma of skin 11/10/2017    Priority: Low   Gout 10/25/2014    Priority: Low   Acute pain of right knee 10/31/2016   Past Surgical History:  Procedure Laterality Date   COLONOSCOPY  2010,03/03/2014   POLYPECTOMY     ROTATOR CUFF REPAIR     left years ago    Family History  Problem Relation Age of Onset   Hypertension Mother    HIV Brother    Other Father        unknown- left family when patient age 85   Colon cancer Neg Hx    Pancreatic cancer Neg Hx    Rectal cancer Neg Hx    Stomach cancer Neg Hx    Colon polyps Neg Hx     Medications- reviewed and updated Current Outpatient Medications  Medication Sig Dispense Refill   allopurinol (ZYLOPRIM) 100 MG tablet TAKE 1 TABLET BY MOUTH EVERY DAY 90 tablet 2   amLODipine (NORVASC) 5 MG tablet TAKE 1 TABLET BY MOUTH EVERY DAY 90 tablet 3   aspirin 81 MG tablet Take 81 mg by mouth daily.     atenolol (TENORMIN) 25 MG tablet TAKE 1 TABLET BY MOUTH EVERY DAY 90 tablet 3   meloxicam (MOBIC) 15 MG tablet TAKE 1 TABLET (15 MG TOTAL) BY MOUTH DAILY AS NEEDED FOR PAIN (GOUT FLARES). 30 tablet 0   MULTIPLE VITAMIN PO Take 1 tablet by mouth daily.     rosuvastatin (CRESTOR) 5 MG tablet Take 1  tablet (5 mg total) by mouth daily. 90 tablet 3   No current facility-administered medications for this visit.    Allergies-reviewed and updated No Known Allergies  Social History   Social History Narrative   Married 42 years in 2021. 2 kids grown. 4 grandkids in 2022.    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 Enjoys vacations and family time   Objective  Objective:  BP 136/84   Pulse (!) 58   Temp (!) 97.5 F (36.4 C)   Ht '6\' 1"'$  (1.854 m)   Wt 211 lb 6.4 oz (95.9 kg)   SpO2 99%   BMI 27.89 kg/m  Gen: NAD, resting comfortably HEENT: Mucous membranes are moist. Oropharynx normal Neck: no thyromegaly CV: RRR no murmurs rubs or gallops Lungs: CTAB no crackles, wheeze, rhonchi Abdomen: soft/nontender/nondistended/normal bowel sounds. No rebound or guarding.  Ext: no edema Skin: warm, dry Neuro: grossly normal, moves all extremities, PERRLA   Assessment and Plan  66 y.o. male presenting for annual physical.  Health Maintenance counseling:  1. Anticipatory guidance: Patient counseled regarding regular dental exams -q6 months, eye exams -yearly,  avoiding smoking and second hand smoke , limiting alcohol to 2 beverages per day - doesn't drink, no illicit drungs.   2. Risk factor reduction:  Advised patient of need for regular exercise and diet rich and fruits and vegetables to reduce risk of heart attack and stroke.  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  Wt Readings from Last 3 Encounters:  03/20/22 211 lb 6.4 oz (95.9 kg)  02/21/22 217 lb (98.4 kg)  02/07/21 215 lb (97.5 kg)  3. Immunizations/screenings/ancillary studies-discussed Prevnar 20 (opts in)as well as updated COVID bivalent vaccination-  holding off for now  Immunization History  Administered Date(s) Administered   H1N1 09/16/2008   Influenza Split 08/03/2017   Influenza Whole 09/21/2010, 09/06/2011   Influenza,inj,Quad PF,6+ Mos 07/27/2018, 07/09/2019   Influenza-Unspecified 08/12/2016, 07/27/2018   PFIZER(Purple Top)SARS-COV-2 Vaccination 02/10/2020, 03/06/2020, 10/29/2020   Td 01/24/2008   Tdap 10/08/2018   Zoster Recombinat (Shingrix) 02/07/2021, 05/09/2021  4. Prostate cancer screening- prior low risk PSA trend-update with PSA today Lab Results  Component Value Date   PSA 1.52 02/08/2021   PSA 1.41 02/06/2020   PSA 1.38 10/08/2018   5. Colon cancer screening - May 31, 2019 with 10-year repeat planned 6. Skin cancer screening-follows with Dr. Pearline Cables of [facility] dermatology-history of skin cancer. advised regular sunscreen use. Denies worrisome, changing, or new skin lesions.  7. Smoking associated screening (lung cancer screening, AAA screen 65-75, UA)-never smoker-no regular screening required 8. STD screening -declines as only active with wife  Status of chronic or acute concerns   #hypertension S: medication: Amlodipine '5Mg'$ , Atenolol 25g Home readings #s: Has home cuff-117/72 usually in the evening BP Readings from Last 3 Encounters:  03/20/22 136/84  02/21/22 126/70  02/07/21 132/86  A/P: Blood pressure slightly higher than usual in the office but excellent home control still (even for aneurysm history)-continue current medicine  #hyperlipidemia with CT cardiac scoring on 05/02/2021 of 25 which is 30th percentile for age S: Medication: Rosuvastatin 5 mg Lab Results  Component Value Date   CHOL 183 02/08/2021   HDL 39.40 02/08/2021   LDLCALC 136 (H) 10/08/2018   LDLDIRECT 120.0 02/08/2021   TRIG 217.0 (H) 02/08/2021   CHOLHDL 5 02/08/2021   A/P: hopefully improved- update LDL today. Continue current meds for now  #Gout S: 0 flares since last visit on Allopurinol '100Mg'$ .  Meloxicam available for  flares- usually trauma triggers but hasnt been as bad Lab Results  Component Value Date   LABURIC 6.2 02/08/2021  A/P:doing well recently- update uric acid but without flares unlikely to increase medicine   #Aortic aneurysm thoracic S: Patient noted to have 3.7-3.8 cm proximal descending thoracic aorta aneurysm  from result note 05/02/2021 " There also was an incidental finding of a slight aneurysm of the thoracic aorta-they recommended CT angiogram or MR angiogram in 1 year" A/P: we will update scan- home BP readings at goal 105-120 so will not make change   Recommended follow up: Return in about 1 year (around 03/21/2023) for physical or sooner if needed.Schedule b4 you leave.  Lab/Order associations:Not fasting- only one mini wheatie   ICD-10-CM   1. Preventative health care  Z00.00     2. Hyperlipidemia, unspecified hyperlipidemia type  E78.5 CBC  with Differential/Platelet    Comprehensive metabolic panel    Lipid panel    3. Essential hypertension  I10     4. Idiopathic gout, unspecified chronicity, unspecified site  M10.00 Uric acid    5. Nocturia  R35.1 PSA    6. Thoracic aortic aneurysm without rupture, unspecified part ([place])  I71.20 CT ANGIO CHEST AORTA W/CM & OR WO/CM      No orders of the defined types were placed in this encounter.   Return precautions advised.  [name], MD

## 2022-03-20 NOTE — Addendum Note (Signed)
Addended by: Clyde Lundborg A on: 03/20/2022 08:43 AM   Modules accepted: Orders

## 2022-03-20 NOTE — Patient Instructions (Addendum)
Prevnar 20 today  If you get the covid shot at pharmacy let us know  the date  Please stop by lab before you go If you have mychart- we will send your results within 3 business days of [place] receiving them.  If you do not have mychart- we will call you about results within 5 business days of [place] receiving them.  *please also note that you will see labs on mychart as soon as they post. I will later go in and write notes on them- will say "notes from Dr. Yong Channel"   We will call you within two weeks about your referral for CT angiogram to follow up aneurysm. If you do not hear within 2 weeks, give [place] a call.    Recommended follow up: Return in about 1 year (around 03/21/2023) for physical or sooner if needed.Schedule b4 you leave.

## 2022-03-21 ENCOUNTER — Other Ambulatory Visit: Payer: Self-pay

## 2022-03-21 DIAGNOSIS — R972 Elevated prostate specific antigen [PSA]: Secondary | ICD-10-CM

## 2022-03-28 ENCOUNTER — Encounter: Payer: Self-pay | Admitting: Family Medicine

## 2022-04-06 ENCOUNTER — Other Ambulatory Visit: Payer: Self-pay | Admitting: Family Medicine

## 2022-04-28 ENCOUNTER — Ambulatory Visit
Admission: RE | Admit: 2022-04-28 | Discharge: 2022-04-28 | Disposition: A | Discharge status: Home / Self Care | Payer: Medicare HMO | Source: Ambulatory Visit | Attending: Family Medicine | Admitting: Family Medicine

## 2022-04-28 DIAGNOSIS — M47814 Spondylosis without myelopathy or radiculopathy, thoracic region: Secondary | ICD-10-CM | POA: Diagnosis not present

## 2022-04-28 DIAGNOSIS — K760 Fatty (change of) liver, not elsewhere classified: Secondary | ICD-10-CM | POA: Diagnosis not present

## 2022-04-28 DIAGNOSIS — I700 Atherosclerosis of aorta: Secondary | ICD-10-CM | POA: Diagnosis not present

## 2022-04-28 DIAGNOSIS — I712 Thoracic aortic aneurysm, without rupture, unspecified: Secondary | ICD-10-CM

## 2022-04-28 MED ORDER — IOPAMIDOL (ISOVUE-370) INJECTION 76%
75.0000 mL | Freq: Once | INTRAVENOUS | Status: AC | PRN
  Administered 2022-04-28: 75 mL via INTRAVENOUS

## 2022-04-29 ENCOUNTER — Encounter: Payer: Self-pay | Admitting: Family Medicine

## 2022-05-05 DIAGNOSIS — R972 Elevated prostate specific antigen [PSA]: Secondary | ICD-10-CM | POA: Diagnosis not present

## 2022-05-05 LAB — PSA: PSA: 1.33

## 2022-07-04 ENCOUNTER — Other Ambulatory Visit: Payer: Self-pay | Admitting: Family Medicine

## 2022-07-28 ENCOUNTER — Encounter: Payer: Self-pay | Admitting: *Deleted

## 2022-10-01 ENCOUNTER — Telehealth: Payer: Self-pay | Admitting: Family Medicine

## 2022-10-01 NOTE — Telephone Encounter (Signed)
Copied from [place] [phone]. Topic: Medicare AWV >> Oct 01, 2022 12:58 PM Gillis Santa wrote: Reason for CRM: LVM FOR PT TO CALL KAREN [phone] TO SCHEDULE AWVI [place]

## 2022-10-02 ENCOUNTER — Other Ambulatory Visit: Payer: Self-pay | Admitting: Family Medicine

## 2022-10-11 ENCOUNTER — Other Ambulatory Visit: Payer: Self-pay | Admitting: Family Medicine

## 2022-10-16 ENCOUNTER — Encounter: Payer: Self-pay | Admitting: *Deleted

## 2022-11-04 ENCOUNTER — Ambulatory Visit (INDEPENDENT_AMBULATORY_CARE_PROVIDER_SITE_OTHER): Payer: Medicare HMO

## 2022-11-04 VITALS — Wt 214.0 lb

## 2022-11-04 DIAGNOSIS — Z0000 Encounter for general adult medical examination without abnormal findings: Secondary | ICD-10-CM | POA: Diagnosis not present

## 2022-11-04 NOTE — Patient Instructions (Signed)
Garrett Avila , Thank you for taking time to come for your Medicare Wellness Visit. I appreciate your ongoing commitment to your health goals. Please review the following plan we discussed and let me know if I can assist you in the future.   These are the goals we discussed:  Goals      Patient Stated     Eat better        This is a list of the screening recommended for you and due dates:  Health Maintenance  Topic Date Due   COVID-19 Vaccine (4 - 2023-24 season) 07/04/2022   Hepatitis C Screening: USPSTF Recommendation to screen - Ages 18-79 yo.  10/30/2098*   Medicare Annual Wellness Visit  11/05/2023   DTaP/Tdap/Td vaccine (3 - Td or Tdap) 10/08/2028   Colon Cancer Screening  05/30/2029   Pneumonia Vaccine  Completed   Flu Shot  Completed   Zoster (Shingles) Vaccine  Completed   HPV Vaccine  Aged Out  *Topic was postponed. The date shown is not the original due date.    Advanced directives: Please bring a copy of your health care power of attorney and living will to the office at your convenience.  Conditions/risks identified: Eat a better diet   Next appointment: Follow up in one year for your annual wellness visit.   Preventive Care 67 Years and Older, Male  Preventive care refers to lifestyle choices and visits with your health care provider that can promote health and wellness. What does preventive care include? A yearly physical exam. This is also called an annual well check. Dental exams once or twice a year. Routine eye exams. Ask your health care provider how often you should have your eyes checked. Personal lifestyle choices, including: Daily care of your teeth and gums. Regular physical activity. Eating a healthy diet. Avoiding tobacco and drug use. Limiting alcohol use. Practicing safe sex. Taking low doses of aspirin every day. Taking vitamin and mineral supplements as recommended by your health care provider. What happens during an annual well  check? The services and screenings done by your health care provider during your annual well check will depend on your age, overall health, lifestyle risk factors, and family history of disease. Counseling  Your health care provider may ask you questions about your: Alcohol use. Tobacco use. Drug use. Emotional well-being. Home and relationship well-being. Sexual activity. Eating habits. History of falls. Memory and ability to understand (cognition). Work and work Statistician. Screening  You may have the following tests or measurements: Height, weight, and BMI. Blood pressure. Lipid and cholesterol levels. These may be checked every 5 years, or more frequently if you are over 67 years old. Skin check. Lung cancer screening. You may have this screening every year starting at age 30 if you have a 30-pack-year history of smoking and currently smoke or have quit within the past 15 years. Fecal occult blood test (FOBT) of the stool. You may have this test every year starting at age 67. Flexible sigmoidoscopy or colonoscopy. You may have a sigmoidoscopy every 5 years or a colonoscopy every 10 years starting at age 67. Prostate cancer screening. Recommendations will vary depending on your family history and other risks. Hepatitis C blood test. Hepatitis B blood test. Sexually transmitted disease (STD) testing. Diabetes screening. This is done by checking your blood sugar (glucose) after you have not eaten for a while (fasting). You may have this done every 1-3 years. Abdominal aortic aneurysm (AAA) screening. You may need this  if you are a current or former smoker. Osteoporosis. You may be screened starting at age 67 if you are at high risk. Talk with your health care provider about your test results, treatment options, and if necessary, the need for more tests. Vaccines  Your health care provider may recommend certain vaccines, such as: Influenza vaccine. This is recommended every  year. Tetanus, diphtheria, and acellular pertussis (Tdap, Td) vaccine. You may need a Td booster every 10 years. Zoster vaccine. You may need this after age 15. Pneumococcal 13-valent conjugate (PCV13) vaccine. One dose is recommended after age 37. Pneumococcal polysaccharide (PPSV23) vaccine. One dose is recommended after age 13. Talk to your health care provider about which screenings and vaccines you need and how often you need them. This information is not intended to replace advice given to you by your health care provider. Make sure you discuss any questions you have with your health care provider. Document Released: 11/16/2015 Document Revised: 07/09/2016 Document Reviewed: 08/21/2015 Elsevier Interactive Patient Education  2017 [place] Prevention in the Home Falls can cause injuries. They can happen to people of all ages. There are many things you can do to make your home safe and to help prevent falls. What can I do on the outside of my home? Regularly fix the edges of walkways and driveways and fix any cracks. Remove anything that might make you trip as you walk through a door, such as a raised step or threshold. Trim any bushes or trees on the path to your home. Use bright outdoor lighting. Clear any walking paths of anything that might make someone trip, such as rocks or tools. Regularly check to see if handrails are loose or broken. Make sure that both sides of any steps have handrails. Any raised decks and porches should have guardrails on the edges. Have any leaves, snow, or ice cleared regularly. Use sand or salt on walking paths during winter. Clean up any spills in your garage right away. This includes oil or grease spills. What can I do in the bathroom? Use night lights. Install grab bars by the toilet and in the tub and shower. Do not use towel bars as grab bars. Use non-skid mats or decals in the tub or shower. If you need to sit down in the shower, use a  plastic, non-slip stool. Keep the floor dry. Clean up any water that spills on the floor as soon as it happens. Remove soap buildup in the tub or shower regularly. Attach bath mats securely with double-sided non-slip rug tape. Do not have throw rugs and other things on the floor that can make you trip. What can I do in the bedroom? Use night lights. Make sure that you have a light by your bed that is easy to reach. Do not use any sheets or blankets that are too big for your bed. They should not hang down onto the floor. Have a firm chair that has side arms. You can use this for support while you get dressed. Do not have throw rugs and other things on the floor that can make you trip. What can I do in the kitchen? Clean up any spills right away. Avoid walking on wet floors. Keep items that you use a lot in easy-to-reach places. If you need to reach something above you, use a strong step stool that has a grab bar. Keep electrical cords out of the way. Do not use floor polish or wax that makes floors slippery.  If you must use wax, use non-skid floor wax. Do not have throw rugs and other things on the floor that can make you trip. What can I do with my stairs? Do not leave any items on the stairs. Make sure that there are handrails on both sides of the stairs and use them. Fix handrails that are broken or loose. Make sure that handrails are as long as the stairways. Check any carpeting to make sure that it is firmly attached to the stairs. Fix any carpet that is loose or worn. Avoid having throw rugs at the top or bottom of the stairs. If you do have throw rugs, attach them to the floor with carpet tape. Make sure that you have a light switch at the top of the stairs and the bottom of the stairs. If you do not have them, ask someone to add them for you. What else can I do to help prevent falls? Wear shoes that: Do not have high heels. Have rubber bottoms. Are comfortable and fit you  well. Are closed at the toe. Do not wear sandals. If you use a stepladder: Make sure that it is fully opened. Do not climb a closed stepladder. Make sure that both sides of the stepladder are locked into place. Ask someone to hold it for you, if possible. Clearly mark and make sure that you can see: Any grab bars or handrails. First and last steps. Where the edge of each step is. Use tools that help you move around (mobility aids) if they are needed. These include: Canes. Walkers. Scooters. Crutches. Turn on the lights when you go into a dark area. Replace any light bulbs as soon as they burn out. Set up your furniture so you have a clear path. Avoid moving your furniture around. If any of your floors are uneven, fix them. If there are any pets around you, be aware of where they are. Review your medicines with your doctor. Some medicines can make you feel dizzy. This can increase your chance of falling. Ask your doctor what other things that you can do to help prevent falls. This information is not intended to replace advice given to you by your health care provider. Make sure you discuss any questions you have with your health care provider. Document Released: 08/16/2009 Document Revised: 03/27/2016 Document Reviewed: 11/24/2014 Elsevier Interactive Patient Education  2017 [employer].

## 2022-11-04 NOTE — Progress Notes (Signed)
I connected with  Garrett Avila on 11/04/22 by a audio enabled telemedicine application and verified that I am speaking with the correct person using two identifiers.  Patient Location: Home  Provider Location: Office/Clinic  I discussed the limitations of evaluation and management by telemedicine. The patient expressed understanding and agreed to proceed.   Subjective:   Garrett Avila is a 67 y.o. male who presents for an Initial Medicare Annual Wellness Visit.  Review of Systems     Cardiac Risk Factors include: advanced age (>24mn, >>95women);dyslipidemia;hypertension;male gender     Objective:    Today's Vitals   11/04/22 0747  Weight: 214 lb (97.1 kg)   Body mass index is 28.23 kg/m.     11/04/2022    7:51 AM 02/24/2014   11:53 AM  Advanced Directives  Does Patient Have a Medical Advance Directive? Yes Patient does not have advance directive  Type of Advance Directive [place] will   Copy of [place] Chart? No - copy requested   Pre-existing out of facility DNR order (yellow form or pink MOST form)  No    Current Medications (verified) Outpatient Encounter Medications as of 11/04/2022  Medication Sig   allopurinol (ZYLOPRIM) 100 MG tablet TAKE 1 TABLET BY MOUTH EVERY DAY   amLODipine (NORVASC) 5 MG tablet TAKE 1 TABLET BY MOUTH EVERY DAY   aspirin 81 MG tablet Take 81 mg by mouth daily.   atenolol (TENORMIN) 25 MG tablet TAKE 1 TABLET BY MOUTH EVERY DAY   FLUAD QUADRIVALENT 0.5 ML injection    meloxicam (MOBIC) 15 MG tablet TAKE 1 TABLET (15 MG TOTAL) BY MOUTH DAILY AS NEEDED FOR PAIN (GOUT FLARES).   MULTIPLE VITAMIN PO Take 1 tablet by mouth daily.   rosuvastatin (CRESTOR) 5 MG tablet TAKE 1 TABLET (5 MG TOTAL) BY MOUTH DAILY.   No facility-administered encounter medications on file as of 11/04/2022.    Allergies (verified) Patient has no known allergies.   History: Past Medical History:  Diagnosis Date    Gout    Hypertension    OSA (obstructive sleep apnea) 04/29/2017   Sleep apnea    uses CPAP   Past Surgical History:  Procedure Laterality Date   COLONOSCOPY  2010,03/03/2014   POLYPECTOMY     ROTATOR CUFF REPAIR     left years ago   Family History  Problem Relation Age of Onset   Hypertension Mother    HIV Brother    Other Father        unknown- left family when patient age 89  Colon cancer Neg Hx    Pancreatic cancer Neg Hx    Rectal cancer Neg Hx    Stomach cancer Neg Hx    Colon polyps Neg Hx    Social History   Socioeconomic History   Marital status: Married    Spouse name: Not on file   Number of children: Not on file   Years of education: Not on file   Highest education level: Not on file  Occupational History   Not on file  Tobacco Use   Smoking status: Never   Smokeless tobacco: Never  Vaping Use   Vaping Use: Never used  Substance and Sexual Activity   Alcohol use: No   Drug use: No   Sexual activity: Not on file  Other Topics Concern   Not on file  Social History Narrative   Married 42 years in 2021. 2 kids  grown. 4 grandkids in 2022.    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 Enjoys vacations and family time   Social Determinants of Health   Financial Resource Strain: Low Risk  (11/04/2022)   Overall Financial Resource Strain (CARDIA)    Difficulty of Paying Living Expenses: Not hard at all  Food Insecurity: No Food Insecurity (11/04/2022)   Hunger Vital Sign    Worried About Running Out of Food in the Last Year: Never true    Ran Out of Food in the Last Year: Never true  Transportation Needs: No Transportation Needs (11/04/2022)   PRAPARE - Hydrologist (Medical): No    Lack of Transportation (Non-Medical): No  Physical Activity: Inactive (11/04/2022)   Exercise Vital Sign    Days of Exercise per Week: 0 days    Minutes of  Exercise per Session: 0 min  Stress: No Stress Concern Present (11/04/2022)   [place]    Feeling of Stress : Not at all  Social Connections: Moderately Integrated (11/04/2022)   Social Connection and Isolation Panel [NHANES]    Frequency of Communication with Friends and Family: More than three times a week    Frequency of Social Gatherings with Friends and Family: More than three times a week    Attends Religious Services: More than 4 times per year    Active Member of [employer] or Organizations: No    Attends Music therapist: Never    Marital Status: Married    Tobacco Counseling Counseling given: Not Answered   Clinical Intake:  Pre-visit preparation completed: Yes  Pain : No/denies pain     BMI - recorded: 28.23 Nutritional Status: BMI <19  Underweight Nutritional Risks: None Diabetes: No  How often do you need to have someone help you when you read instructions, pamphlets, or other written materials from your doctor or pharmacy?: 1 - Never  Diabetic?no  Interpreter Needed?: No  Information entered by :: Garrett Rakes, LPN   Activities of Daily Living    11/04/2022    7:52 AM  In your present state of health, do you have any difficulty performing the following activities:  Hearing? 0  Vision? 0  Difficulty concentrating or making decisions? 0  Walking or climbing stairs? 0  Dressing or bathing? 0  Doing errands, shopping? 0  Preparing Food and eating ? N  Using the Toilet? N  In the past six months, have you accidently leaked urine? N  Do you have problems with loss of bowel control? N  Managing your Medications? N  Managing your Finances? N  Housekeeping or managing your Housekeeping? N    Patient Care Team: Garrett Olp, MD as PCP - General (Family Medicine)  Indicate any recent Medical Services you may have received from other than [facility] providers in the past year (date  may be approximate).     Assessment:   This is a routine wellness examination for [place].  Hearing/Vision screen Hearing Screening - Comments:: Pt denies any hearing issues  Vision Screening - Comments:: Pt will follow up with [place] provider for annul eye exams   Dietary issues and exercise activities discussed: Current Exercise Habits: The patient has a physically strenuous job, but has no regular exercise apart from work.   Goals Addressed  This Visit's Progress    Patient Stated       Eat better       Depression Screen    11/04/2022    7:50 AM 02/21/2022    3:14 PM 02/07/2021    7:51 AM 02/06/2020    8:16 AM 02/04/2019    7:58 AM 12/23/2018   12:55 PM 10/08/2018   12:54 PM  PHQ 2/9 Scores  PHQ - 2 Score 0 0 0 0 0 0 0  PHQ- 9 Score    0       Fall Risk    11/04/2022    7:52 AM 02/21/2022    3:14 PM 02/07/2021    7:51 AM 06/24/2017   11:16 AM  [place] in the past year? 0 0 0 No  Number falls in past yr: 0 0 0   Injury with Fall? 0 0 0   Risk for fall due to : No Fall Risks No Fall Risks    Follow up Falls prevention discussed       FALL RISK PREVENTION PERTAINING TO THE HOME:  Any stairs in or around the home? Yes  If so, are there any without handrails? No  Home free of loose throw rugs in walkways, pet beds, electrical cords, etc? Yes  Adequate lighting in your home to reduce risk of falls? Yes   ASSISTIVE DEVICES UTILIZED TO PREVENT FALLS:  Life alert? No  Use of a cane, walker or w/c? No  Grab bars in the bathroom? Yes  Shower chair or bench in shower? No  Elevated toilet seat or a handicapped toilet? No   TIMED UP AND GO:  Was the test performed? No .   Cognitive Function:        11/04/2022    7:53 AM  6CIT Screen  What Year? 0 points  What month? 0 points  What time? 0 points  Count back from 20 0 points  Months in reverse 0 points  Repeat phrase 0 points  Total Score 0 points    Immunizations Immunization  History  Administered Date(s) Administered   H1N1 09/16/2008   Influenza Split 08/03/2017   Influenza Whole 09/21/2010, 09/06/2011   Influenza,inj,Quad PF,6+ Mos 07/27/2018, 07/09/2019   Influenza-Unspecified 08/12/2016, 07/27/2018, 09/15/2022   PFIZER(Purple Top)SARS-COV-2 Vaccination 02/10/2020, 03/06/2020, 10/29/2020   PNEUMOCOCCAL CONJUGATE-20 03/20/2022   Td 01/24/2008   Tdap 10/08/2018   Zoster Recombinat (Shingrix) 02/07/2021, 05/09/2021    TDAP status: Up to date  Flu Vaccine status: Up to date  Pneumococcal vaccine status: Up to date  Covid-19 vaccine status: Completed vaccines  Qualifies for Shingles Vaccine? Yes   Zostavax completed Yes   Shingrix Completed?: Yes  Screening Tests Health Maintenance  Topic Date Due   COVID-19 Vaccine (4 - 2023-24 season) 07/04/2022   Hepatitis C Screening  10/30/2098 (Originally 09/14/1974)   Medicare Annual Wellness (AWV)  11/05/2023   DTaP/Tdap/Td (3 - Td or Tdap) 10/08/2028   COLONOSCOPY (Pts 45-49yr Insurance coverage will need to be confirmed)  05/30/2029   Pneumonia Vaccine 67 Years old  Completed   INFLUENZA VACCINE  Completed   Zoster Vaccines- Shingrix  Completed   HPV VACCINES  Aged Out    Health Maintenance  Health Maintenance Due  Topic Date Due   COVID-19 Vaccine (4 - 2023-24 season) 07/04/2022    Colorectal cancer screening: Type of screening: Colonoscopy. Completed 05/31/19. Repeat every 10 years   Additional Screening:  Hepatitis C Screening: does qualify;  Vision  Screening: Recommended annual ophthalmology exams for early detection of glaucoma and other disorders of the eye. Is the patient up to date with their annual eye exam?  Yes  Who is the provider or what is the name of the office in which the patient attends annual eye exams? A provider on [address]  If pt is not established with a provider, would they like to be referred to a provider to establish care? No .   Dental Screening:  Recommended annual dental exams for proper oral hygiene  Community Resource Referral / Chronic Care Management: CRR required this visit?  No   CCM required this visit?  No      Plan:     I have personally reviewed and noted the following in the patient's chart:   Medical and social history Use of alcohol, tobacco or illicit drugs  Current medications and supplements including opioid prescriptions. Patient is not currently taking opioid prescriptions. Functional ability and status Nutritional status Physical activity Advanced directives List of other physicians Hospitalizations, surgeries, and ER visits in previous 12 months Vitals Screenings to include cognitive, depression, and falls Referrals and appointments  In addition, I have reviewed and discussed with patient certain preventive protocols, quality metrics, and best practice recommendations. A written personalized care plan for preventive services as well as general preventive health recommendations were provided to patient.     Willette Brace, LPN   02/07/8031   Nurse Notes: none

## 2023-03-27 ENCOUNTER — Ambulatory Visit (INDEPENDENT_AMBULATORY_CARE_PROVIDER_SITE_OTHER): Payer: Medicare HMO | Admitting: Family Medicine

## 2023-03-27 ENCOUNTER — Encounter: Payer: Self-pay | Admitting: Family Medicine

## 2023-03-27 ENCOUNTER — Other Ambulatory Visit: Payer: Self-pay | Admitting: Family Medicine

## 2023-03-27 VITALS — BP 136/88 | HR 57 | Temp 97.7°F | Ht 73.0 in | Wt 220.8 lb

## 2023-03-27 DIAGNOSIS — I712 Thoracic aortic aneurysm, without rupture, unspecified: Secondary | ICD-10-CM

## 2023-03-27 DIAGNOSIS — Z0000 Encounter for general adult medical examination without abnormal findings: Secondary | ICD-10-CM

## 2023-03-27 DIAGNOSIS — Z125 Encounter for screening for malignant neoplasm of prostate: Secondary | ICD-10-CM | POA: Diagnosis not present

## 2023-03-27 DIAGNOSIS — I10 Essential (primary) hypertension: Secondary | ICD-10-CM

## 2023-03-27 DIAGNOSIS — E785 Hyperlipidemia, unspecified: Secondary | ICD-10-CM | POA: Diagnosis not present

## 2023-03-27 DIAGNOSIS — M1000 Idiopathic gout, unspecified site: Secondary | ICD-10-CM | POA: Diagnosis not present

## 2023-03-27 LAB — LIPID PANEL
Cholesterol: 134 mg/dL (ref 0–200)
HDL: 39 mg/dL — ABNORMAL LOW (ref >39.00)
NonHDL: 94.62
Total CHOL/HDL Ratio: 3
Triglycerides: 245 mg/dL — ABNORMAL HIGH (ref 0.0–149.0)
VLDL: 49 mg/dL — ABNORMAL HIGH (ref 0.0–40.0)

## 2023-03-27 LAB — COMPREHENSIVE METABOLIC PANEL
ALT: 22 U/L (ref 0–53)
AST: 22 U/L (ref 0–37)
Albumin: 4.2 g/dL (ref 3.5–5.2)
Alkaline Phosphatase: 84 U/L (ref 39–117)
BUN: 14 mg/dL (ref 6–23)
CO2: 30 mEq/L (ref 19–32)
Calcium: 9.1 mg/dL (ref 8.4–10.5)
Chloride: 102 mEq/L (ref 96–112)
Creatinine, Ser: 1.14 mg/dL (ref 0.40–1.50)
GFR: 67.01 mL/min (ref >60.00)
Glucose, Bld: 99 mg/dL (ref 70–99)
Potassium: 4.4 mEq/L (ref 3.5–5.1)
Sodium: 139 mEq/L (ref 135–145)
Total Bilirubin: 1 mg/dL (ref 0.2–1.2)
Total Protein: 7.1 g/dL (ref 6.0–8.3)

## 2023-03-27 LAB — PSA, MEDICARE: PSA: 1.45 ng/ml (ref 0.10–4.00)

## 2023-03-27 LAB — CBC WITH DIFFERENTIAL/PLATELET
Basophils Absolute: 0 10*3/uL (ref 0.0–0.1)
Basophils Relative: 0.6 % (ref 0.0–3.0)
Eosinophils Absolute: 0.2 10*3/uL (ref 0.0–0.7)
Eosinophils Relative: 2.7 % (ref 0.0–5.0)
HCT: 41.9 % (ref 39.0–52.0)
Hemoglobin: 14.4 g/dL (ref 13.0–17.0)
Lymphocytes Relative: 34.8 % (ref 12.0–46.0)
Lymphs Abs: 2.2 10*3/uL (ref 0.7–4.0)
MCHC: 34.3 g/dL (ref 30.0–36.0)
MCV: 93.7 fl (ref 78.0–100.0)
Monocytes Absolute: 0.6 10*3/uL (ref 0.1–1.0)
Monocytes Relative: 8.7 % (ref 3.0–12.0)
Neutro Abs: 3.4 10*3/uL (ref 1.4–7.7)
Neutrophils Relative %: 53.2 % (ref 43.0–77.0)
Platelets: 168 10*3/uL (ref 150.0–400.0)
RBC: 4.48 Mil/uL (ref 4.22–5.81)
RDW: 12.9 % (ref 11.5–15.5)
WBC: 6.4 10*3/uL (ref 4.0–10.5)

## 2023-03-27 LAB — URINALYSIS, ROUTINE W REFLEX MICROSCOPIC
Bilirubin Urine: NEGATIVE
Ketones, ur: NEGATIVE
Leukocytes,Ua: NEGATIVE
Nitrite: NEGATIVE
Specific Gravity, Urine: 1.02 (ref 1.000–1.030)
Total Protein, Urine: NEGATIVE
Urine Glucose: NEGATIVE
Urobilinogen, UA: 0.2 (ref 0.0–1.0)
pH: 6 (ref 5.0–8.0)

## 2023-03-27 LAB — LDL CHOLESTEROL, DIRECT: Direct LDL: 70 mg/dL

## 2023-03-27 LAB — URIC ACID: Uric Acid, Serum: 5.7 mg/dL (ref 4.0–7.8)

## 2023-03-27 NOTE — Patient Instructions (Addendum)
We will call you within two weeks about your referral for aneurysm follow up imaging  through [facility].  Their phone number is [phone].  Please call them if you have not heard in 1-2 weeks  Lets work on getting those 9 extra lbs back off and possibly more over course of next year- gradual reduction of some of the riskier items- such as even just doing once a day sweet tea if doing 3x a day  Please stop by lab before you go If you have mychart- we will send your results within 3 business days of [place] receiving them.  If you do not have mychart- we will call you about results within 5 business days of [place] receiving them.  *please also note that you will see labs on mychart as soon as they post. I will later go in and write notes on them- will say "notes from Dr. Durene Cal"   Recommended follow up: Return in about 1 year (around 03/26/2024) for physical or sooner if needed.Schedule b4 you leave.

## 2023-03-27 NOTE — Progress Notes (Signed)
Phone: [phone]   Subjective:  Patient presents today for their annual physical. Chief complaint-noted.   See problem oriented charting- ROS- full  review of systems was completed and negative  Per full ROS sheet completed by patient  The following were reviewed and entered/updated in epic: Past Medical History:  Diagnosis Date   Gout    Hypertension    OSA (obstructive sleep apnea) 04/29/2017   Sleep apnea    uses CPAP   Patient Active Problem List   Diagnosis Date Noted   Thoracic aortic aneurysm (HCC) 03/20/2022    Priority: High   OSA (obstructive sleep apnea) 04/29/2017    Priority: Medium    Hyperlipidemia 05/05/2016    Priority: Medium    Essential hypertension 01/24/2008    Priority: Medium    History of basal cell carcinoma of skin 11/10/2017    Priority: Low   Gout 10/25/2014    Priority: Low   Acute pain of right knee 10/31/2016   Past Surgical History:  Procedure Laterality Date   COLONOSCOPY  2010,03/03/2014   POLYPECTOMY     ROTATOR CUFF REPAIR     left years ago    Family History  Problem Relation Age of Onset   Hypertension Mother    HIV Brother    Other Father        unknown- left family when patient age 22   Colon cancer Neg Hx    Pancreatic cancer Neg Hx    Rectal cancer Neg Hx    Stomach cancer Neg Hx    Colon polyps Neg Hx     Medications- reviewed and updated Current Outpatient Medications  Medication Sig Dispense Refill   allopurinol (ZYLOPRIM) 100 MG tablet TAKE 1 TABLET BY MOUTH EVERY DAY 90 tablet 2   amLODipine (NORVASC) 5 MG tablet TAKE 1 TABLET BY MOUTH EVERY DAY 90 tablet 3   aspirin 81 MG tablet Take 81 mg by mouth daily.     atenolol (TENORMIN) 25 MG tablet TAKE 1 TABLET BY MOUTH EVERY DAY 90 tablet 3   meloxicam (MOBIC) 15 MG tablet TAKE 1 TABLET (15 MG TOTAL) BY MOUTH DAILY AS NEEDED FOR PAIN (GOUT FLARES). 30 tablet 0   MULTIPLE VITAMIN PO Take 1 tablet by mouth daily.     rosuvastatin (CRESTOR) 5 MG tablet TAKE 1  TABLET (5 MG TOTAL) BY MOUTH DAILY. 90 tablet 3   No current facility-administered medications for this visit.    Allergies-reviewed and updated No Known Allergies  Social History   Social History Narrative   Married 42 years in 2021. 2 kids grown. 4 grandkids in 2022.    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 Enjoys vacations and family time   Objective  Objective:  BP 136/88   Pulse (!) 57   Temp 97.7 F (36.5 C)   Ht 6\' 1"  (1.854 m)   Wt 220 lb 12.8 oz (100.2 kg)   SpO2 95%   BMI 29.13 kg/m  Gen: NAD, resting comfortably HEENT: Mucous membranes are moist. Oropharynx normal Neck: no thyromegaly CV: RRR no murmurs rubs or gallops Lungs: CTAB no crackles, wheeze, rhonchi Abdomen: soft/nontender/nondistended/normal bowel sounds. No rebound or guarding.  Ext: no edema Skin: warm, dry Neuro: grossly normal, moves all extremities, PERRLA   Assessment and Plan  67 y.o. male presenting for annual physical.  Health Maintenance counseling: 1.  Anticipatory guidance: Patient counseled regarding regular dental exams -q6 months, eye exams -yearly,  avoiding smoking and second hand smoke, limiting alcohol to 2 beverages per day - doesn't drink, no illicit drugs .   2. Risk factor reduction:  Advised patient of need for regular exercise and diet rich and fruits and vegetables to reduce risk of heart attack and stroke.  Exercise- still mowing 20 yards- staying very active.  Diet/weight management-weight up 9 lbs in last year- feels will come down as he gets more active this spring/summer- advised moderation in food intake.  Wt Readings from Last 3 Encounters:  03/27/23 220 lb 12.8 oz (100.2 kg)  11/04/22 214 lb (97.1 kg)  03/20/22 211 lb 6.4 oz (95.9 kg)  3. Immunizations/screenings/ancillary studies- up to date other than declines COVID   Immunization History  Administered Date(s)  Administered   H1N1 09/16/2008   Influenza Split 08/03/2017   Influenza Whole 09/21/2010, 09/06/2011   Influenza,inj,Quad PF,6+ Mos 07/27/2018, 07/09/2019   Influenza-Unspecified 08/12/2016, 07/27/2018, 09/15/2022   PFIZER(Purple Top)SARS-COV-2 Vaccination 02/10/2020, 03/06/2020, 10/29/2020   PNEUMOCOCCAL CONJUGATE-20 03/20/2022   Td 01/24/2008   Tdap 10/08/2018   Zoster Recombinat (Shingrix) 02/07/2021, 05/09/2021   4. Prostate cancer screening-  saw urology Dr. Marlou Porch last year for elevation  (apparently urinary tract infection triggered) but PSA came back down- check again today Lab Results  Component Value Date   PSA 1.33 05/05/2022   PSA 4.00 03/20/2022   PSA 1.52 02/08/2021   5. Colon cancer screening - May 31, 2019 with 10-year repeat planned  6. Skin cancer screening-follows with Dr. Wallace Cullens of [facility] dermatology -history of skin cancer. advised regular sunscreen use. Denies worrisome, changing, or new skin lesions.  7. Smoking associated screening (lung cancer screening, AAA screen 65-75, UA)-never smoker-no regular screening required  8. STD screening -declines as only active with wife   Status of chronic or acute concerns   #hypertension S: medication: Amlodipine 5Mg , Atenolol 25g Home readings #s: Has home cuff-high 120s/70s at home BP Readings from Last 3 Encounters:  03/27/23 136/88  03/20/22 136/84  02/21/22 126/70  A/P: with how good #s look at home and aneurysm stability- we opted to continue current medications   #hyperlipidemia-with CT cardiac scoring on 05/02/2021 of 25 which is 38th percentile for age S: Medication: Rosuvastatin 5 mg daily, he prefers to stay on aspirin 81 mg  Lab Results  Component Value Date   CHOL 140 03/20/2022   HDL 46.30 03/20/2022   LDLCALC 64 03/20/2022   LDLDIRECT 120.0 02/08/2021   TRIG 150.0 (H) 03/20/2022   CHOLHDL 3 03/20/2022   A/P: lipids look great even for someone with calcium on the heart- likely continue current  medications as long as stable today- check labs  #Gout S: medication: Allopurinol 100Mg .  Meloxicam available for flares- has not needed in last year Lab Results  Component Value Date   LABURIC 6.5 03/20/2022  A/P: with no gout flares even if uric acid above 6 again  unlikely to change meds   #Aortic aneurysm thoracic S: Patient noted to have 3.7-3.8 cm proximal descending thoracic aorta aneurysm  from result note 05/02/2021 " There also was an incidental finding of a slight aneurysm of the thoracic aorta-they recommended CT angiogram or MR angiogram in 1 year" -stable 04/28/22 A/P: has been stable- will reorder testing. Avoid quinolones   #Fatty liver -Incidental finding on CT angio of chest 04/28/2022- discussed importance of weight loss . Has been down to 193 in past  #  OSA - hasn't been snoring as much- not using CPAP as much- encouraged to restart  Recommended follow up: Return in about 1 year (around 03/26/2024) for physical or sooner if needed.Schedule b4 you leave. Future Appointments  Date Time Provider Department Center  11/10/2023  7:45 AM [facility] ANNUAL WELLNESS VISIT 1 [facility]   Lab/Order associations: fasting   ICD-10-CM   1. Preventative health care  Z00.00     2. Thoracic aortic aneurysm without rupture, unspecified part (HCC) Chronic I71.20 CT ANGIO CHEST AORTA W/CM & OR WO/CM    3. Essential hypertension  I10 Comprehensive metabolic panel    CBC with Differential/Platelet    Lipid panel    Urinalysis, Routine w reflex microscopic    4. Hyperlipidemia, unspecified hyperlipidemia type  E78.5 Comprehensive metabolic panel    CBC with Differential/Platelet    Lipid panel    Urinalysis, Routine w reflex microscopic    5. Idiopathic gout, unspecified chronicity, unspecified site  M10.00 Uric acid    6. Screening for prostate cancer  Z12.5 PSA, Medicare ( [place] only)      No orders of the defined types were placed in this encounter.   Return  precautions advised.  Tana Conch, MD

## 2023-04-30 ENCOUNTER — Ambulatory Visit
Admission: RE | Admit: 2023-04-30 | Discharge: 2023-04-30 | Disposition: A | Discharge status: Home / Self Care | Payer: Medicare HMO | Source: Ambulatory Visit | Attending: Family Medicine | Admitting: Family Medicine

## 2023-04-30 ENCOUNTER — Encounter: Payer: Self-pay | Admitting: Family Medicine

## 2023-04-30 DIAGNOSIS — I700 Atherosclerosis of aorta: Secondary | ICD-10-CM | POA: Insufficient documentation

## 2023-04-30 DIAGNOSIS — I7121 Aneurysm of the ascending aorta, without rupture: Secondary | ICD-10-CM | POA: Diagnosis not present

## 2023-04-30 DIAGNOSIS — I712 Thoracic aortic aneurysm, without rupture, unspecified: Secondary | ICD-10-CM

## 2023-04-30 MED ORDER — IOPAMIDOL (ISOVUE-370) INJECTION 76%
75.0000 mL | Freq: Once | INTRAVENOUS | Status: AC | PRN
  Administered 2023-04-30: 75 mL via INTRAVENOUS

## 2023-05-28 DIAGNOSIS — L820 Inflamed seborrheic keratosis: Secondary | ICD-10-CM | POA: Diagnosis not present

## 2023-05-28 DIAGNOSIS — D225 Melanocytic nevi of trunk: Secondary | ICD-10-CM | POA: Diagnosis not present

## 2023-05-28 DIAGNOSIS — Z1283 Encounter for screening for malignant neoplasm of skin: Secondary | ICD-10-CM | POA: Diagnosis not present

## 2023-05-28 DIAGNOSIS — D485 Neoplasm of uncertain behavior of skin: Secondary | ICD-10-CM | POA: Diagnosis not present

## 2023-06-29 ENCOUNTER — Other Ambulatory Visit: Payer: Self-pay | Admitting: Family Medicine

## 2023-07-01 NOTE — Telephone Encounter (Signed)
Pt needs refill on 2 RX on the bottom message.

## 2023-10-18 ENCOUNTER — Other Ambulatory Visit: Payer: Self-pay | Admitting: Family Medicine

## 2023-11-28 DIAGNOSIS — J0110 Acute frontal sinusitis, unspecified: Secondary | ICD-10-CM | POA: Diagnosis not present

## 2024-03-04 ENCOUNTER — Other Ambulatory Visit: Payer: Self-pay | Admitting: Family Medicine

## 2024-03-29 ENCOUNTER — Ambulatory Visit: Payer: Self-pay | Admitting: Family Medicine

## 2024-03-29 ENCOUNTER — Encounter: Payer: Self-pay | Admitting: Family Medicine

## 2024-03-29 ENCOUNTER — Ambulatory Visit (INDEPENDENT_AMBULATORY_CARE_PROVIDER_SITE_OTHER): Payer: Medicare HMO | Admitting: Family Medicine

## 2024-03-29 VITALS — BP 120/70 | HR 64 | Temp 97.2°F | Ht 73.0 in | Wt 219.0 lb

## 2024-03-29 DIAGNOSIS — M1000 Idiopathic gout, unspecified site: Secondary | ICD-10-CM

## 2024-03-29 DIAGNOSIS — Z125 Encounter for screening for malignant neoplasm of prostate: Secondary | ICD-10-CM | POA: Diagnosis not present

## 2024-03-29 DIAGNOSIS — I700 Atherosclerosis of aorta: Secondary | ICD-10-CM | POA: Diagnosis not present

## 2024-03-29 DIAGNOSIS — I10 Essential (primary) hypertension: Secondary | ICD-10-CM

## 2024-03-29 DIAGNOSIS — Z131 Encounter for screening for diabetes mellitus: Secondary | ICD-10-CM

## 2024-03-29 DIAGNOSIS — Z0000 Encounter for general adult medical examination without abnormal findings: Secondary | ICD-10-CM | POA: Diagnosis not present

## 2024-03-29 DIAGNOSIS — I712 Thoracic aortic aneurysm, without rupture, unspecified: Secondary | ICD-10-CM | POA: Diagnosis not present

## 2024-03-29 DIAGNOSIS — E785 Hyperlipidemia, unspecified: Secondary | ICD-10-CM

## 2024-03-29 DIAGNOSIS — E663 Overweight: Secondary | ICD-10-CM | POA: Diagnosis not present

## 2024-03-29 LAB — COMPREHENSIVE METABOLIC PANEL WITH GFR
ALT: 21 U/L (ref 0–53)
AST: 19 U/L (ref 0–37)
Albumin: 4.4 g/dL (ref 3.5–5.2)
Alkaline Phosphatase: 76 U/L (ref 39–117)
BUN: 13 mg/dL (ref 6–23)
CO2: 27 meq/L (ref 19–32)
Calcium: 9.4 mg/dL (ref 8.4–10.5)
Chloride: 101 meq/L (ref 96–112)
Creatinine, Ser: 0.99 mg/dL (ref 0.40–1.50)
GFR: 78.81 mL/min (ref >60.00)
Glucose, Bld: 110 mg/dL — ABNORMAL HIGH (ref 70–99)
Potassium: 4 meq/L (ref 3.5–5.1)
Sodium: 138 meq/L (ref 135–145)
Total Bilirubin: 1 mg/dL (ref 0.2–1.2)
Total Protein: 7.6 g/dL (ref 6.0–8.3)

## 2024-03-29 LAB — CBC WITH DIFFERENTIAL/PLATELET
Basophils Absolute: 0 10*3/uL (ref 0.0–0.1)
Basophils Relative: 0.5 % (ref 0.0–3.0)
Eosinophils Absolute: 0.2 10*3/uL (ref 0.0–0.7)
Eosinophils Relative: 3.4 % (ref 0.0–5.0)
HCT: 43 % (ref 39.0–52.0)
Hemoglobin: 15 g/dL (ref 13.0–17.0)
Lymphocytes Relative: 30.5 % (ref 12.0–46.0)
Lymphs Abs: 1.7 10*3/uL (ref 0.7–4.0)
MCHC: 34.9 g/dL (ref 30.0–36.0)
MCV: 92.8 fl (ref 78.0–100.0)
Monocytes Absolute: 0.5 10*3/uL (ref 0.1–1.0)
Monocytes Relative: 8.2 % (ref 3.0–12.0)
Neutro Abs: 3.1 10*3/uL (ref 1.4–7.7)
Neutrophils Relative %: 57.4 % (ref 43.0–77.0)
Platelets: 167 10*3/uL (ref 150.0–400.0)
RBC: 4.63 Mil/uL (ref 4.22–5.81)
RDW: 12.9 % (ref 11.5–15.5)
WBC: 5.5 10*3/uL (ref 4.0–10.5)

## 2024-03-29 LAB — LIPID PANEL
Cholesterol: 132 mg/dL (ref 0–200)
HDL: 40.3 mg/dL (ref >39.00)
LDL Cholesterol: 47 mg/dL (ref 0–99)
NonHDL: 92.07
Total CHOL/HDL Ratio: 3
Triglycerides: 224 mg/dL — ABNORMAL HIGH (ref 0.0–149.0)
VLDL: 44.8 mg/dL — ABNORMAL HIGH (ref 0.0–40.0)

## 2024-03-29 LAB — URIC ACID: Uric Acid, Serum: 6.2 mg/dL (ref 4.0–7.8)

## 2024-03-29 LAB — PSA, MEDICARE: PSA: 1.5 ng/mL (ref 0.10–4.00)

## 2024-03-29 LAB — HEMOGLOBIN A1C: Hgb A1c MFr Bld: 5.7 % (ref 4.6–6.5)

## 2024-03-29 MED ORDER — SILDENAFIL CITRATE 100 MG PO TABS
100.0000 mg | ORAL_TABLET | Freq: Every day | ORAL | 5 refills | Status: AC | PRN
Start: 2024-03-29 — End: ?

## 2024-03-29 NOTE — Patient Instructions (Addendum)
 Health Maintenance Due  Topic Date Due   Medicare Annual Wellness (AWV)  11/05/2023  You are eligible to schedule your annual wellness visit with our nurse specialist Brian Campanile.  Please consider scheduling this before you leave today  will trial Viagra- half tablet to start  We will call you within two weeks about your referral to CT angio chest to monitor aneurysm through [facility].  Their phone number is [phone].  Please call them if you have not heard in 1-2 weeks   Please stop by lab before you go If you have mychart- we will send your results within 3 business days of us  receiving them.  If you do not have mychart- we will call you about results within 5 business days of us  receiving them.  *please also note that you will see labs on mychart as soon as they post. I will later go in and write notes on them- will say "notes from Dr. Arlene Ben"   Recommended follow up: Return in about 1 year (around 03/29/2025) for physical or sooner if needed.Schedule b4 you leave.

## 2024-03-29 NOTE — Progress Notes (Addendum)
 Phone: [phone]   Subjective:  Patient presents today for their annual physical. Chief complaint-noted.   See problem oriented charting- ROS- full  review of systems was completed and negative  Per full ROS sheet completed by patient except for topics noted under acute/chronic concerns  The following were reviewed and entered/updated in epic: Past Medical History:  Diagnosis Date   Gout    Hypertension    OSA (obstructive sleep apnea) 04/29/2017   Sleep apnea    uses CPAP   Patient Active Problem List   Diagnosis Date Noted   Thoracic aortic aneurysm (HCC) 03/20/2022    Priority: High   OSA (obstructive sleep apnea) 04/29/2017    Priority: Medium    Hyperlipidemia 05/05/2016    Priority: Medium    Essential hypertension 01/24/2008    Priority: Medium    History of basal cell carcinoma of skin 11/10/2017    Priority: Low   Gout 10/25/2014    Priority: Low   Aortic atherosclerosis (HCC) 04/30/2023   Acute pain of right knee 10/31/2016   Past Surgical History:  Procedure Laterality Date   COLONOSCOPY  2010,03/03/2014   POLYPECTOMY     ROTATOR CUFF REPAIR     left years ago    Family History  Problem Relation Age of Onset   Hypertension Mother    HIV Brother    Other Father        unknown- left family when patient age 18   Colon cancer Neg Hx    Pancreatic cancer Neg Hx    Rectal cancer Neg Hx    Stomach cancer Neg Hx    Colon polyps Neg Hx     Medications- reviewed and updated Current Outpatient Medications  Medication Sig Dispense Refill   allopurinol  (ZYLOPRIM ) 100 MG tablet TAKE 1 TABLET BY MOUTH EVERY DAY 90 tablet 2   amLODipine  (NORVASC ) 5 MG tablet TAKE 1 TABLET BY MOUTH EVERY DAY 90 tablet 3   aspirin 81 MG tablet Take 81 mg by mouth daily.     atenolol  (TENORMIN ) 25 MG tablet TAKE 1 TABLET BY MOUTH EVERY DAY 90 tablet 3   meloxicam  (MOBIC ) 15 MG tablet TAKE 1 TABLET (15 MG TOTAL) BY MOUTH DAILY AS NEEDED FOR PAIN (GOUT FLARES). 30 tablet 0    MULTIPLE VITAMIN PO Take 1 tablet by mouth daily.     rosuvastatin  (CRESTOR ) 5 MG tablet TAKE 1 TABLET (5 MG TOTAL) BY MOUTH DAILY. 90 tablet 3   sildenafil (VIAGRA) 100 MG tablet Take 1 tablet (100 mg total) by mouth daily as needed for erectile dysfunction. 10 tablet 5   No current facility-administered medications for this visit.    Allergies-reviewed and updated No Known Allergies  Social History   Social History Narrative   Married 42 years in 2021. 2 kids grown. 4 grandkids in 2025.    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 Oropharynx normal Neck: no thyromegaly CV: RRR no murmurs rubs or gallops Lungs: CTAB no crackles, wheeze, rhonchi Abdomen: soft/nontender/nondistended/normal bowel sounds. No  rebound or guarding.  Ext: no edema Skin: warm, dry Neuro: grossly normal, moves all extremities, PERRLA Declines genitourinary or rectal exam   Assessment and Plan  68 y.o. male presenting for annual physical.  Health Maintenance counseling: 1. Anticipatory guidance: Patient counseled regarding regular dental exams -q6 months, eye exams -yearly,  avoiding smoking and second hand smoke , limiting alcohol to 2 beverages per day - doesn't drink, no illicit drugs .   2. Risk factor reduction:  Advised patient of need for regular exercise and diet rich and fruits and vegetables to reduce risk of heart attack and stroke.  Exercise- still mowing 28 yards- very active with this and stays active in general.  Diet/weight management-weight down  pounds from last year. Appetite down some in last year. Mild weight loss.  Wt Readings from Last 3  Encounters:  03/29/24 219 lb (99.3 kg)  03/27/23 220 lb 12.8 oz (100.2 kg)  11/04/22 214 lb (97.1 kg)  3. Immunizations/screenings/ancillary studies- holding off of COVID vaccine but otherwise up to date  Immunization History  Administered Date(s) Administered   H1N1 09/16/2008   Influenza Split 08/03/2017   Influenza Whole 09/21/2010, 09/06/2011   Influenza,inj,Quad PF,6+ Mos 07/27/2018, 07/09/2019   Influenza-Unspecified 08/12/2016, 07/27/2018, 09/15/2022   PFIZER(Purple Top)SARS-COV-2 Vaccination 02/10/2020, 03/06/2020, 10/29/2020   PNEUMOCOCCAL CONJUGATE-20 03/20/2022   Td 01/24/2008   Tdap 10/08/2018   Zoster Recombinant(Shingrix) 02/07/2021, 05/09/2021  4. Prostate cancer screening- prior bump in PSA 2023 referred to Dr. Dulcy Gibney but PSA came back down after urinary tract infection treatment(s)   Lab Results  Component Value Date   PSA 1.45 03/27/2023   PSA 1.33 05/05/2022   PSA 4.00 03/20/2022   5. Colon cancer screening - May 31 2019 with 10 year repeat  6. Skin cancer screening- prior Dr. Wyn Heater dermatology - history skin cancer but has seen Dr. Del Favia in last year-advised regular sunscreen use- does longsleeves- Denies worrisome, changing, or new skin lesions.  7. Smoking associated screening (lung cancer screening, AAA screen 65-75, UA)- never smoker- no regular screening 8. STD screening - opts out as only active with wife  Status of chronic or acute concerns   #erectile dysfunction (ED) issues - libido ok, morning erections still, main issue is duration- will trial Viagra- half tablet to start  #hypertension S: medication: Amlodipine  5Mg , Atenolol  25g A/P: stable- continue current medicines   #hyperlipidemia-with CT cardiac scoring on 05/02/2021 of 25 which is 38th percentile for age #aortic atherosclerosis  S: Medication: Rosuvastatin  5 mg daily, he prefers to stay on aspirin 81 mg  Lab Results  Component Value Date   CHOL 134 03/27/2023   HDL 39.00 (L)  03/27/2023   LDLCALC 64 03/20/2022   LDLDIRECT 70.0 03/27/2023   TRIG 245.0 (H) 03/27/2023   CHOLHDL 3 03/27/2023   A/P: LDL at goal last year- likely continue current medications - update today. Triglyceride(s) slightly high- encouraged healthy lifestyle Aortic atherosclerosis (presumed stable)- LDL goal ideally <70 - at goal here as well as for coronary artery calcium   #Gout S: medication: Allopurinol  100Mg .  Meloxicam  available for flares Lab Results  Component Value Date   LABURIC 5.7 03/27/2023  A/P:still no flares since starting allopurinol - updated uric acid today   #Aortic aneurysm thoracic S: Patient noted to have 3.7-3.8 cm proximal descending thoracic aorta aneurysm  from result note 05/02/2021 " There also was an incidental finding of a slight aneurysm of the thoracic aorta-they recommended CT angiogram or MR angiogram in 1 year" -stable  04/28/22 and 04/30/23  A/P: suspect stable- update CT in about a month   #Fatty liver -Incidental finding on CT angio of chest 04/28/2022   -check LFTs- work on healthy eating and regular exercise   Recommended follow up: Return in about 1 year (around 03/29/2025) for physical or sooner if needed.Schedule b4 you leave.  Lab/Order associations: fasting   ICD-10-CM   1. Preventative health care  Z00.00     2. Essential hypertension  I10     3. Hyperlipidemia, unspecified hyperlipidemia type  E78.5 Comprehensive metabolic panel with GFR    CBC with Differential/Platelet    Lipid panel    4. Idiopathic gout, unspecified chronicity, unspecified site  M10.00 Uric acid    5. Aortic atherosclerosis (HCC)  I70.0     6. Thoracic aortic aneurysm without rupture, unspecified part (HCC)  I71.20 CT ANGIO CHEST AORTA W/CM & OR WO/CM    7. Screening for prostate cancer  Z12.5 PSA, Medicare    8. Screening for diabetes mellitus  Z13.1 Hemoglobin A1c    9. Overweight  E66.3 Hemoglobin A1c      Meds ordered this encounter  Medications    sildenafil (VIAGRA) 100 MG tablet    Sig: Take 1 tablet (100 mg total) by mouth daily as needed for erectile dysfunction.    Dispense:  10 tablet    Refill:  5    Return precautions advised.  Garrett Crooked, MD

## 2024-04-01 ENCOUNTER — Other Ambulatory Visit: Payer: Self-pay | Admitting: Family Medicine

## 2024-05-13 ENCOUNTER — Ambulatory Visit
Admission: RE | Admit: 2024-05-13 | Discharge: 2024-05-13 | Disposition: A | Discharge status: Home / Self Care | Source: Ambulatory Visit | Attending: Family Medicine | Admitting: Family Medicine

## 2024-05-13 DIAGNOSIS — I712 Thoracic aortic aneurysm, without rupture, unspecified: Secondary | ICD-10-CM

## 2024-05-13 DIAGNOSIS — R918 Other nonspecific abnormal finding of lung field: Secondary | ICD-10-CM | POA: Diagnosis not present

## 2024-05-13 DIAGNOSIS — I77810 Thoracic aortic ectasia: Secondary | ICD-10-CM | POA: Diagnosis not present

## 2024-05-13 MED ORDER — IOPAMIDOL (ISOVUE-370) INJECTION 76%
75.0000 mL | Freq: Once | INTRAVENOUS | Status: AC | PRN
  Administered 2024-05-13: 75 mL via INTRAVENOUS

## 2024-05-16 DIAGNOSIS — D225 Melanocytic nevi of trunk: Secondary | ICD-10-CM | POA: Diagnosis not present

## 2024-05-16 DIAGNOSIS — C44311 Basal cell carcinoma of skin of nose: Secondary | ICD-10-CM | POA: Diagnosis not present

## 2024-05-16 DIAGNOSIS — Z1283 Encounter for screening for malignant neoplasm of skin: Secondary | ICD-10-CM | POA: Diagnosis not present

## 2024-07-01 ENCOUNTER — Other Ambulatory Visit: Payer: Self-pay | Admitting: Family Medicine

## 2024-10-14 ENCOUNTER — Other Ambulatory Visit: Payer: Self-pay | Admitting: Family Medicine

## 2024-11-07 ENCOUNTER — Ambulatory Visit (INDEPENDENT_AMBULATORY_CARE_PROVIDER_SITE_OTHER)

## 2024-11-07 VITALS — BP 124/78 | HR 59 | Temp 97.3°F | Ht 73.0 in | Wt 221.0 lb

## 2024-11-07 DIAGNOSIS — Z0000 Encounter for general adult medical examination without abnormal findings: Secondary | ICD-10-CM

## 2024-11-07 NOTE — Patient Instructions (Signed)
 Garrett Avila,  Thank you for taking the time for your Medicare Wellness Visit. I appreciate your continued commitment to your health goals. Please review the care plan we discussed, and feel free to reach out if I can assist you further.  Please note that Annual Wellness Visits do not include a physical exam. Some assessments may be limited, especially if the visit was conducted virtually. If needed, we may recommend an in-person follow-up with your provider.  Ongoing Care Seeing your primary care provider every 3 to 6 months helps us  monitor your health and provide consistent, personalized care.   Referrals If a referral was made during today's visit and you haven't received any updates within two weeks, please contact the referred provider directly to check on the status.  Recommended Screenings:  Health Maintenance  Topic Date Due   Medicare Annual Wellness Visit  11/05/2023   Flu Shot  06/03/2024   COVID-19 Vaccine (4 - 2025-26 season) 07/04/2024   Hepatitis C Screening  10/30/2098*   DTaP/Tdap/Td vaccine (3 - Td or Tdap) 10/08/2028   Colon Cancer Screening  05/30/2029   Pneumococcal Vaccine for age over 55  Completed   Zoster (Shingles) Vaccine  Completed   Meningitis B Vaccine  Aged Out  *Topic was postponed. The date shown is not the original due date.       11/04/2022    7:51 AM  Advanced Directives  Does Patient Have a Medical Advance Directive? Yes  Type of Estate Agent of [place];Living will  Copy of Healthcare Power of Attorney in Chart? No - copy requested    Vision: Annual vision screenings are recommended for early detection of glaucoma, cataracts, and diabetic retinopathy. These exams can also reveal signs of chronic conditions such as diabetes and high blood pressure.  Dental: Annual dental screenings help detect early signs of oral cancer, gum disease, and other conditions linked to overall health, including heart disease and  diabetes.  Please see the attached documents for additional preventive care recommendations.

## 2024-11-07 NOTE — Progress Notes (Addendum)
 "  Chief Complaint  Patient presents with   Medicare Wellness     Subjective:   Garrett Avila is a 69 y.o. male who presents for a Medicare Annual Wellness Visit.  Visit info / Clinical Intake: Medicare Wellness Visit Type:: Subsequent Annual Wellness Visit Persons participating in visit and providing information:: patient Medicare Wellness Visit Mode:: In-person (required for WTM) Interpreter Needed?: No Pre-visit prep was completed: yes AWV questionnaire completed by patient prior to visit?: no Living arrangements:: lives with spouse/significant other Patient's Overall Health Status Rating: excellent Typical amount of pain: none Does pain affect daily life?: no Are you currently prescribed opioids?: no  Dietary Habits and Nutritional Risks How many meals a day?: 3 Eats fruit and vegetables daily?: (!) no Most meals are obtained by: preparing own meals In the last 2 weeks, have you had any of the following?: none Diabetic:: no  Functional Status Activities of Daily Living (to include ambulation/medication): Independent Ambulation: Independent Medication Administration: Independent Home Management (perform basic housework or laundry): Independent Manage your own finances?: yes Primary transportation is: driving Concerns about vision?: no *vision screening is required for WTM* Concerns about hearing?: no  Fall Screening Falls in the past year?: 0 Number of falls in past year: 0 Was there an injury with Fall?: 0 Fall Risk Category Calculator: 0 Patient Fall Risk Level: Low Fall Risk  Fall Risk Patient at Risk for Falls Due to: No Fall Risks Fall risk Follow up: Falls evaluation completed  Home and Transportation Safety: All rugs have non-skid backing?: N/A, no rugs All stairs or steps have railings?: yes Grab bars in the bathtub or shower?: yes Have non-skid surface in bathtub or shower?: yes Good home lighting?: yes Regular seat belt use?: yes Hospital stays  in the last year:: no  Cognitive Assessment Difficulty concentrating, remembering, or making decisions? : no Will 6CIT or Mini Cog be Completed: yes What year is it?: 0 points What month is it?: 0 points Give patient an address phrase to remember (5 components): [address] [place]  About what time is it?: 0 points Count backwards from 20 to 1: 0 points Say the months of the year in reverse: 0 points Repeat the address phrase from earlier: 2 points 6 CIT Score: 2 points  Advance Directives (For Healthcare) Does Patient Have a Medical Advance Directive?: Yes Type of Advance Directive: Healthcare Power of Attorney Copy of Healthcare Power of Attorney in Chart?: No - copy requested  Reviewed/Updated  Reviewed/Updated: Reviewed All (Medical, Surgical, Family, Medications, Allergies, Care Teams, Patient Goals)    Allergies (verified) Patient has no known allergies.   Current Medications (verified) Outpatient Encounter Medications as of 11/07/2024  Medication Sig   amLODipine  (NORVASC ) 5 MG tablet TAKE 1 TABLET BY MOUTH EVERY DAY   atenolol  (TENORMIN ) 25 MG tablet TAKE 1 TABLET BY MOUTH EVERY DAY   MULTIPLE VITAMIN PO Take 1 tablet by mouth daily.   rosuvastatin  (CRESTOR ) 5 MG tablet TAKE 1 TABLET (5 MG TOTAL) BY MOUTH DAILY.   sildenafil  (VIAGRA ) 100 MG tablet Take 1 tablet (100 mg total) by mouth daily as needed for erectile dysfunction.   [DISCONTINUED] allopurinol  (ZYLOPRIM ) 100 MG tablet TAKE 1 TABLET BY MOUTH EVERY DAY   [DISCONTINUED] aspirin 81 MG tablet Take 81 mg by mouth daily.   [DISCONTINUED] meloxicam  (MOBIC ) 15 MG tablet TAKE 1 TABLET (15 MG TOTAL) BY MOUTH DAILY AS NEEDED FOR PAIN (GOUT FLARES).   No facility-administered encounter medications on file as of  11/07/2024.    History: Past Medical History:  Diagnosis Date   Gout    Hypertension    OSA (obstructive sleep apnea) 04/29/2017   Sleep apnea    uses CPAP   Past Surgical History:  Procedure Laterality  Date   COLONOSCOPY  2010,03/03/2014   POLYPECTOMY     ROTATOR CUFF REPAIR     left years ago   Family History  Problem Relation Age of Onset   Hypertension Mother    HIV Brother    Other Father        unknown- left family when patient age 63   Colon cancer Neg Hx    Pancreatic cancer Neg Hx    Rectal cancer Neg Hx    Stomach cancer Neg Hx    Colon polyps Neg Hx    Social History   Occupational History   Not on file  Tobacco Use   Smoking status: Never   Smokeless tobacco: Never  Vaping Use   Vaping status: Never Used  Substance and Sexual Activity   Alcohol use: No   Drug use: No   Sexual activity: Yes    Birth control/protection: None   Tobacco Counseling Counseling given: Not Answered  SDOH Screenings   Food Insecurity: No Food Insecurity (11/07/2024)  Housing: Unknown (11/07/2024)  Transportation Needs: No Transportation Needs (11/07/2024)  Utilities: Not At Risk (11/07/2024)  Depression (PHQ2-9): Low Risk (11/07/2024)  Financial Resource Strain: Low Risk (11/04/2022)  Physical Activity: Inactive (11/07/2024)  Social Connections: Socially Integrated (11/07/2024)  Stress: No Stress Concern Present (11/07/2024)  Tobacco Use: Low Risk (11/07/2024)  Health Literacy: Adequate Health Literacy (11/07/2024)   See flowsheets for full screening details  Depression Screen PHQ 2 & 9 Depression Scale- Over the past 2 weeks, how often have you been bothered by any of the following problems? Little interest or pleasure in doing things: 0 Feeling down, depressed, or hopeless (PHQ Adolescent also includes...irritable): 0 PHQ-2 Total Score: 0 Trouble falling or staying asleep, or sleeping too much: 0 Feeling tired or having little energy: 0 Poor appetite or overeating (PHQ Adolescent also includes...weight loss): 0 Feeling bad about yourself - or that you are a failure or have let yourself or your family down: 0 Trouble concentrating on things, such as reading the newspaper or watching  television (PHQ Adolescent also includes...like school work): 0 Moving or speaking so slowly that other people could have noticed. Or the opposite - being so fidgety or restless that you have been moving around a lot more than usual: 0 Thoughts that you would be better off dead, or of hurting yourself in some way: 0 PHQ-9 Total Score: 0 If you checked off any problems, how difficult have these problems made it for you to do your work, take care of things at home, or get along with other people?: Not difficult at all     Goals Addressed               This Visit's Progress     keep weight down and stay active (pt-stated)        Keep weight down and stay active              Objective:    Today's Vitals   11/07/24 0809  BP: 124/78  Pulse: (!) 59  Temp: (!) 97.3 F (36.3 C)  SpO2: 96%  Weight: 221 lb (100.2 kg)  Height: 6' 1 (1.854 m)   Body mass index is 29.16 kg/m.  Hearing/Vision  screen Hearing Screening - Comments:: Pt denies any hearing issues  Vision Screening - Comments:: Wears rx glasses - up to date with routine eye exams with eye provider in [place]  [facility]  Immunizations and Health Maintenance Health Maintenance  Topic Date Due   Influenza Vaccine  06/03/2024   COVID-19 Vaccine (4 - 2025-26 season) 07/04/2024   Hepatitis C Screening  10/30/2098 (Originally 09/14/1974)   Medicare Annual Wellness (AWV)  11/07/2025   DTaP/Tdap/Td (3 - Td or Tdap) 10/08/2028   Colonoscopy  05/30/2029   Pneumococcal Vaccine: 50+ Years  Completed   Zoster Vaccines- Shingrix  Completed   Meningococcal B Vaccine  Aged Out        Assessment/Plan:  This is a routine wellness examination for [place].  Patient Care Team: Katrinka Garnette KIDD, MD as PCP - General (Family Medicine)  I have personally reviewed and noted the following in the patients chart:   Medical and social history Use of alcohol, tobacco or illicit drugs  Current medications and supplements including  opioid prescriptions. Functional ability and status Nutritional status Physical activity Advanced directives List of other physicians Hospitalizations, surgeries, and ER visits in previous 12 months Vitals Screenings to include cognitive, depression, and falls Referrals and appointments  No orders of the defined types were placed in this encounter.  In addition, I have reviewed and discussed with patient certain preventive protocols, quality metrics, and best practice recommendations. A written personalized care plan for preventive services as well as general preventive health recommendations were provided to patient.   Ellouise VEAR Haws, LPN   06/06/7972   Return in about 53 weeks (around 11/13/2025).  After Visit Summary: (In Person-Printed) AVS printed and given to the patient  Nurse Notes: No voiced or noted concerns at this time   "

## 2024-11-29 ENCOUNTER — Encounter: Payer: Self-pay | Admitting: Family Medicine

## 2024-11-29 ENCOUNTER — Ambulatory Visit (INDEPENDENT_AMBULATORY_CARE_PROVIDER_SITE_OTHER): Admitting: Family Medicine

## 2024-11-29 VITALS — BP 138/74 | HR 82 | Temp 98.6°F | Ht 73.0 in | Wt 226.2 lb

## 2024-11-29 DIAGNOSIS — J40 Bronchitis, not specified as acute or chronic: Secondary | ICD-10-CM

## 2024-11-29 DIAGNOSIS — J0100 Acute maxillary sinusitis, unspecified: Secondary | ICD-10-CM

## 2024-11-29 MED ORDER — AMOXICILLIN-POT CLAVULANATE 875-125 MG PO TABS: 1.0000 | ORAL_TABLET | Freq: Two times a day (BID) | ORAL | 0 refills | Status: AC

## 2024-11-29 MED ORDER — PREDNISONE 20 MG PO TABS: 40.0000 mg | ORAL_TABLET | Freq: Every day | ORAL | 0 refills | Status: AC

## 2024-11-29 NOTE — Patient Instructions (Signed)
 Meds sent

## 2024-11-29 NOTE — Progress Notes (Signed)
 "  Subjective:     Patient ID: Garrett Avila, male    DOB: 23-Jan-1956, 69 y.o.   MRN: [id number]  Chief Complaint  Patient presents with   Cough    Pt is coughing, congestion, ears stopped up x4weeks    Discussed the use of AI scribe software for clinical note transcription with the patient, who gave verbal consent to proceed.  History of Present Illness Garrett Avila is a 69 year old male who presents with a month-long history of upper respiratory symptoms.  He has been experiencing upper respiratory symptoms for a month, including rhinorrhea, congestion, and a persistent productive cough that worsened a few days ago. The cough produces phlegm that is bright yellow and dark brown  He experiences cephalalgia, particularly behind the eyes, and a sensation of ear fullness with popping and crackling, especially when blowing his nose. He notes a sensation of his ears 'closing in a little bit.' No otalgia is present.  No fever or dyspnea. He reports facial pain, pressure, and congestion, which he experiences about every other year, although he has not had these symptoms in the last few years.  He has used prednisone  in the past for similar symptoms but does not recall the last time he used it. He has taken Augmentin  before without any known issues. .    Health Maintenance Due  Topic Date Due   COVID-19 Vaccine (4 - 2025-26 season) 07/04/2024    Past Medical History:  Diagnosis Date   Gout    Hypertension    OSA (obstructive sleep apnea) 04/29/2017   Sleep apnea    uses CPAP    Past Surgical History:  Procedure Laterality Date   COLONOSCOPY  2010,03/03/2014   POLYPECTOMY     ROTATOR CUFF REPAIR     left years ago    Current Medications[1]  Allergies[2] ROS neg/noncontributory except as noted HPI/below      Objective:     BP 138/74 (BP Location: Left Arm, Patient Position: Sitting, Cuff Size: Large)   Pulse 82   Temp 98.6 F (37 C) (Temporal)   Ht 6' 1  (1.854 m)   Wt 226 lb 4 oz (102.6 kg)   SpO2 97%   BMI 29.85 kg/m  Wt Readings from Last 3 Encounters:  11/29/24 226 lb 4 oz (102.6 kg)  11/07/24 221 lb (100.2 kg)  03/29/24 219 lb (99.3 kg)    Physical Exam GENERAL: Well developed, well nourished, no acute distress. HEAD EYES EARS NOSE THROAT: Normocephalic, atraumatic, conjunctiva not injected, sclera nonicteric. Right and left tympanic membranes retracted. Oropharynx clear, moist, without exudates. Sinuses non-tender to percussion. CARDIAC: Regular rate and rhythm, S1 S2 present, no murmur,. NECK: Supple, no thyromegaly, no lymphadenopathy, . LUNGS: Clear to auscultation bilaterally, no wheezes.. MUSCULOSKELETAL: No gross abnormalities. NEUROLOGICAL: Alert and oriented x3, cranial nerves II through XII intact. PSYCHIATRIC: Normal mood, good eye contact.       Assessment & Plan:  Acute non-recurrent maxillary sinusitis  Bronchitis  Other orders -     predniSONE ; Take 2 tablets (40 mg total) by mouth daily with breakfast for 5 days.  Dispense: 10 tablet; Refill: 0 -     Amoxicillin -Pot Clavulanate; Take 1 tablet by mouth 2 (two) times daily.  Dispense: 20 tablet; Refill: 0    Assessment and Plan Assessment & Plan Acute maxillary sinusitis   Symptoms have persisted for one month, including rhinorrhea, congestion, facial pain, and pressure. Tympanic membranes are retracted without fluid, and there  is no fever or otalgia. Sinuses are non-tender to percussion. The duration and worsening symptoms suggest bacterial sinusitis. Prescribe Augmentin  for bacterial coverage and prednisone  as needed for inflammation.  Bronchitis   He has a recent onset of productive cough with sputum that changed from bright yellow to dark brown. There is no fever or dyspnea, and no wheezing on examination. The color change in sputum suggests possible bacterial bronchitis. Prescribe Augmentin  to cover potential bacterial bronchitis and prednisone  as  needed for inflammation.     Return if symptoms worsen or fail to improve.  Jenkins CHRISTELLA Carrel, MD     [1]  Current Outpatient Medications:    amLODipine  (NORVASC ) 5 MG tablet, TAKE 1 TABLET BY MOUTH EVERY DAY, Disp: 90 tablet, Rfl: 3   amoxicillin -clavulanate (AUGMENTIN ) 875-125 MG tablet, Take 1 tablet by mouth 2 (two) times daily., Disp: 20 tablet, Rfl: 0   atenolol  (TENORMIN ) 25 MG tablet, TAKE 1 TABLET BY MOUTH EVERY DAY, Disp: 90 tablet, Rfl: 3   MULTIPLE VITAMIN PO, Take 1 tablet by mouth daily., Disp: , Rfl:    predniSONE  (DELTASONE ) 20 MG tablet, Take 2 tablets (40 mg total) by mouth daily with breakfast for 5 days., Disp: 10 tablet, Rfl: 0   rosuvastatin  (CRESTOR ) 5 MG tablet, TAKE 1 TABLET (5 MG TOTAL) BY MOUTH DAILY., Disp: 90 tablet, Rfl: 3   sildenafil  (VIAGRA ) 100 MG tablet, Take 1 tablet (100 mg total) by mouth daily as needed for erectile dysfunction., Disp: 10 tablet, Rfl: 5 [2] No Known Allergies  "

## 2025-03-31 ENCOUNTER — Encounter: Admitting: Family Medicine

## 2025-11-13 ENCOUNTER — Ambulatory Visit
